# Patient Record
Sex: Female | Born: 1945
Health system: Southern US, Community
[De-identification: ages and names within clinical notes are randomized; demographics above are authoritative.]

## PROBLEM LIST (undated history)

## (undated) DIAGNOSIS — E785 Hyperlipidemia, unspecified: Secondary | ICD-10-CM

## (undated) DIAGNOSIS — Z9889 Other specified postprocedural states: Secondary | ICD-10-CM

## (undated) DIAGNOSIS — R112 Nausea with vomiting, unspecified: Secondary | ICD-10-CM

## (undated) DIAGNOSIS — T7840XA Allergy, unspecified, initial encounter: Secondary | ICD-10-CM

## (undated) DIAGNOSIS — M1990 Unspecified osteoarthritis, unspecified site: Secondary | ICD-10-CM

## (undated) DIAGNOSIS — K219 Gastro-esophageal reflux disease without esophagitis: Secondary | ICD-10-CM

## (undated) DIAGNOSIS — E876 Hypokalemia: Secondary | ICD-10-CM

## (undated) DIAGNOSIS — D689 Coagulation defect, unspecified: Secondary | ICD-10-CM

## (undated) DIAGNOSIS — T4145XA Adverse effect of unspecified anesthetic, initial encounter: Secondary | ICD-10-CM

## (undated) DIAGNOSIS — H409 Unspecified glaucoma: Secondary | ICD-10-CM

## (undated) DIAGNOSIS — I499 Cardiac arrhythmia, unspecified: Secondary | ICD-10-CM

## (undated) DIAGNOSIS — E039 Hypothyroidism, unspecified: Secondary | ICD-10-CM

## (undated) DIAGNOSIS — I82409 Acute embolism and thrombosis of unspecified deep veins of unspecified lower extremity: Secondary | ICD-10-CM

## (undated) DIAGNOSIS — J302 Other seasonal allergic rhinitis: Secondary | ICD-10-CM

## (undated) DIAGNOSIS — T8859XA Other complications of anesthesia, initial encounter: Secondary | ICD-10-CM

## (undated) DIAGNOSIS — H269 Unspecified cataract: Secondary | ICD-10-CM

## (undated) DIAGNOSIS — G4730 Sleep apnea, unspecified: Secondary | ICD-10-CM

## (undated) DIAGNOSIS — I10 Essential (primary) hypertension: Secondary | ICD-10-CM

## (undated) HISTORY — DX: Hyperlipidemia, unspecified: E78.5

## (undated) HISTORY — DX: Allergy, unspecified, initial encounter: T78.40XA

## (undated) HISTORY — PX: COLONOSCOPY: SHX174

## (undated) HISTORY — DX: Unspecified cataract: H26.9

## (undated) HISTORY — PX: ABDOMINAL HYSTERECTOMY: SHX81

## (undated) HISTORY — PX: DENTAL SURGERY: SHX609

## (undated) HISTORY — PX: CATARACT EXTRACTION: SUR2

## (undated) HISTORY — DX: Hypokalemia: E87.6

## (undated) HISTORY — DX: Coagulation defect, unspecified: D68.9

## (undated) HISTORY — DX: Sleep apnea, unspecified: G47.30

## (undated) HISTORY — DX: Acute embolism and thrombosis of unspecified deep veins of unspecified lower extremity: I82.409

## (undated) HISTORY — DX: Unspecified glaucoma: H40.9

---

## 1999-02-14 ENCOUNTER — Other Ambulatory Visit: Admission: RE | Admit: 1999-02-14 | Discharge: 1999-02-14 | Payer: Self-pay | Admitting: *Deleted

## 2000-03-17 ENCOUNTER — Ambulatory Visit (HOSPITAL_BASED_OUTPATIENT_CLINIC_OR_DEPARTMENT_OTHER): Admission: RE | Admit: 2000-03-17 | Discharge: 2000-03-17 | Payer: Self-pay | Admitting: Internal Medicine

## 2000-09-22 ENCOUNTER — Encounter: Admission: RE | Admit: 2000-09-22 | Discharge: 2000-09-22 | Payer: Self-pay | Admitting: Internal Medicine

## 2000-09-22 ENCOUNTER — Encounter: Payer: Self-pay | Admitting: Internal Medicine

## 2003-12-15 ENCOUNTER — Encounter: Admission: RE | Admit: 2003-12-15 | Discharge: 2003-12-15 | Payer: Self-pay | Admitting: Neurosurgery

## 2003-12-29 ENCOUNTER — Encounter: Admission: RE | Admit: 2003-12-29 | Discharge: 2003-12-29 | Payer: Self-pay | Admitting: Neurosurgery

## 2005-04-23 ENCOUNTER — Ambulatory Visit (HOSPITAL_COMMUNITY): Admission: RE | Admit: 2005-04-23 | Discharge: 2005-04-23 | Payer: Self-pay | Admitting: Internal Medicine

## 2006-07-22 ENCOUNTER — Ambulatory Visit (HOSPITAL_COMMUNITY): Admission: RE | Admit: 2006-07-22 | Discharge: 2006-07-22 | Payer: Self-pay | Admitting: Internal Medicine

## 2010-05-06 ENCOUNTER — Encounter: Payer: Self-pay | Admitting: Neurosurgery

## 2010-07-30 ENCOUNTER — Encounter: Payer: Self-pay | Admitting: Internal Medicine

## 2010-07-30 DIAGNOSIS — Q774 Achondroplasia: Secondary | ICD-10-CM

## 2010-07-30 DIAGNOSIS — K219 Gastro-esophageal reflux disease without esophagitis: Secondary | ICD-10-CM

## 2010-07-30 DIAGNOSIS — M1990 Unspecified osteoarthritis, unspecified site: Secondary | ICD-10-CM

## 2010-07-30 DIAGNOSIS — E039 Hypothyroidism, unspecified: Secondary | ICD-10-CM

## 2010-07-30 DIAGNOSIS — I10 Essential (primary) hypertension: Secondary | ICD-10-CM | POA: Insufficient documentation

## 2010-08-21 ENCOUNTER — Encounter: Payer: Self-pay | Admitting: *Deleted

## 2010-11-30 ENCOUNTER — Encounter: Payer: Self-pay | Admitting: Gastroenterology

## 2012-09-17 ENCOUNTER — Other Ambulatory Visit: Payer: Self-pay | Admitting: Neurosurgery

## 2012-09-17 DIAGNOSIS — M549 Dorsalgia, unspecified: Secondary | ICD-10-CM

## 2012-09-25 ENCOUNTER — Ambulatory Visit
Admission: RE | Admit: 2012-09-25 | Discharge: 2012-09-25 | Disposition: A | Discharge status: Home / Self Care | Payer: 59 | Source: Ambulatory Visit | Attending: Neurosurgery | Admitting: Neurosurgery

## 2012-09-25 VITALS — BP 151/62 | HR 61 | Ht <= 58 in | Wt 147.0 lb

## 2012-09-25 DIAGNOSIS — M549 Dorsalgia, unspecified: Secondary | ICD-10-CM

## 2012-09-25 MED ORDER — DIAZEPAM 5 MG PO TABS
5.0000 mg | ORAL_TABLET | Freq: Once | ORAL | Status: AC
  Administered 2012-09-25: 5 mg via ORAL

## 2012-09-25 MED ORDER — ONDANSETRON HCL 4 MG/2ML IJ SOLN
4.0000 mg | Freq: Once | INTRAMUSCULAR | Status: AC
  Administered 2012-09-25: 4 mg via INTRAMUSCULAR

## 2012-09-25 MED ORDER — IOHEXOL 180 MG/ML  SOLN
15.0000 mL | Freq: Once | INTRAMUSCULAR | Status: AC | PRN
  Administered 2012-09-25: 15 mL via INTRATHECAL

## 2012-09-25 MED ORDER — MEPERIDINE HCL 100 MG/ML IJ SOLN
75.0000 mg | Freq: Once | INTRAMUSCULAR | Status: AC
  Administered 2012-09-25: 75 mg via INTRAMUSCULAR

## 2012-10-09 ENCOUNTER — Other Ambulatory Visit: Payer: Self-pay | Admitting: Neurosurgery

## 2012-10-13 ENCOUNTER — Encounter (HOSPITAL_COMMUNITY): Payer: Self-pay | Admitting: Pharmacy Technician

## 2012-10-15 ENCOUNTER — Encounter (HOSPITAL_COMMUNITY)
Admission: RE | Admit: 2012-10-15 | Discharge: 2012-10-15 | Disposition: A | Discharge status: Home / Self Care | Payer: 59 | Source: Ambulatory Visit | Attending: Neurosurgery | Admitting: Neurosurgery

## 2012-10-15 ENCOUNTER — Encounter (HOSPITAL_COMMUNITY): Payer: Self-pay

## 2012-10-15 ENCOUNTER — Ambulatory Visit (HOSPITAL_COMMUNITY)
Admission: RE | Admit: 2012-10-15 | Discharge: 2012-10-15 | Disposition: A | Discharge status: Home / Self Care | Payer: 59 | Source: Ambulatory Visit | Attending: Neurosurgery | Admitting: Neurosurgery

## 2012-10-15 DIAGNOSIS — I77810 Thoracic aortic ectasia: Secondary | ICD-10-CM | POA: Insufficient documentation

## 2012-10-15 DIAGNOSIS — Z0183 Encounter for blood typing: Secondary | ICD-10-CM | POA: Insufficient documentation

## 2012-10-15 DIAGNOSIS — Z01810 Encounter for preprocedural cardiovascular examination: Secondary | ICD-10-CM | POA: Insufficient documentation

## 2012-10-15 DIAGNOSIS — I498 Other specified cardiac arrhythmias: Secondary | ICD-10-CM | POA: Insufficient documentation

## 2012-10-15 DIAGNOSIS — Z01818 Encounter for other preprocedural examination: Secondary | ICD-10-CM | POA: Insufficient documentation

## 2012-10-15 DIAGNOSIS — Z01812 Encounter for preprocedural laboratory examination: Secondary | ICD-10-CM | POA: Insufficient documentation

## 2012-10-15 HISTORY — DX: Cardiac arrhythmia, unspecified: I49.9

## 2012-10-15 HISTORY — DX: Other seasonal allergic rhinitis: J30.2

## 2012-10-15 HISTORY — DX: Gastro-esophageal reflux disease without esophagitis: K21.9

## 2012-10-15 HISTORY — DX: Hypothyroidism, unspecified: E03.9

## 2012-10-15 HISTORY — DX: Essential (primary) hypertension: I10

## 2012-10-15 HISTORY — DX: Other complications of anesthesia, initial encounter: T88.59XA

## 2012-10-15 HISTORY — DX: Nausea with vomiting, unspecified: R11.2

## 2012-10-15 HISTORY — DX: Other specified postprocedural states: Z98.890

## 2012-10-15 HISTORY — DX: Adverse effect of unspecified anesthetic, initial encounter: T41.45XA

## 2012-10-15 HISTORY — DX: Unspecified osteoarthritis, unspecified site: M19.90

## 2012-10-15 LAB — BASIC METABOLIC PANEL
CO2: 26 mEq/L (ref 19–32)
Chloride: 107 mEq/L (ref 96–112)
Creatinine, Ser: 0.84 mg/dL (ref 0.50–1.10)
GFR calc Af Amer: 83 mL/min — ABNORMAL LOW (ref >90)
GFR calc non Af Amer: 72 mL/min — ABNORMAL LOW (ref >90)
Potassium: 3.6 mEq/L (ref 3.5–5.1)
Sodium: 142 mEq/L (ref 135–145)

## 2012-10-15 LAB — CBC
HCT: 39.3 % (ref 36.0–46.0)
MCH: 31.7 pg (ref 26.0–34.0)
MCHC: 34.1 g/dL (ref 30.0–36.0)
RDW: 13.6 % (ref 11.5–15.5)

## 2012-10-15 LAB — SURGICAL PCR SCREEN: Staphylococcus aureus: NEGATIVE

## 2012-10-15 LAB — TYPE AND SCREEN: Antibody Screen: NEGATIVE

## 2012-10-15 NOTE — Pre-Procedure Instructions (Signed)
Kaitlyn Barnes  10/15/2012   Your procedure is scheduled on:  Wednesday, July 9th.  Report to [facility] at 9:15AM.             Enter Main Entrance, take [place] Elevators to 3rd Floor- Short Stay.  Call this number if you have problems the morning of surgery: [phone]   Remember:   Do not eat food or drink liquids after midnight.   Take these medicines the morning of surgery with A SIP OF WATER: fexofenadine (ALLEGRA), GuaiFENesin (MUCINEX PO),levothyroxine (SYNTHROID, LEVOTHROID), omeprazole (PRILOSEC)  Stop taking Aspirin, Coumadin, Plavix, Effient and Herbal medications.  Do not take any NSAIDs OZ:DGUYQIHKV (MOBIC)    Ibuprofen,  Advil,Naproxen or any medication containing Aspirin.      Do not wear jewelry, make-up or nail polish.  Do not wear lotions, powders, or perfumes. You may wear deodorant.  Do not shave 48 hours prior to surgery. Men may shave face and neck.  Do not bring valuables to the hospital.  [facility] is not responsible for any belongings or valuables.  Contacts, dentures or bridgework may not be worn into surgery.  Leave suitcase in the car. After surgery it may be brought to your room.  For patients admitted to the hospital, checkout time is 11:00 AM the day of discharge.    Special Instructions: Shower using CHG 2 nights before surgery and the night before surgery.  If you shower the day of surgery use CHG.  Use special wash - you have one bottle of CHG for all showers.  You should use approximately 1/3 of the bottle for each shower.   Please read over the following fact sheets that you were given: Pain Booklet, Coughing and Deep Breathing, Blood Transfusion Information and Surgical Site Infection Prevention

## 2012-10-15 NOTE — Progress Notes (Signed)
10/15/12 1210  OBSTRUCTIVE SLEEP APNEA  Have you ever been diagnosed with sleep apnea through a sleep study? No  Do you snore loudly (loud enough to be heard through closed doors)?  1  Do you often feel tired, fatigued, or sleepy during the daytime? 0  Has anyone observed you stop breathing during your sleep? 0  Do you have, or are you being treated for high blood pressure? 1  BMI more than 35 kg/m2? 1  Age over 65 years old? 1  Neck circumference greater than 40 cm/18 inches? 1  Gender: 0  Obstructive Sleep Apnea Score 5  Score 4 or greater  Results sent to PCP

## 2012-10-20 MED ORDER — CEFAZOLIN SODIUM-DEXTROSE 2-3 GM-% IV SOLR
2.0000 g | INTRAVENOUS | Status: AC
Start: 2012-10-21 — End: 2012-10-21
  Administered 2012-10-21: 2 g via INTRAVENOUS
  Filled 2012-10-20: qty 50

## 2012-10-21 ENCOUNTER — Ambulatory Visit (HOSPITAL_COMMUNITY): Payer: 59

## 2012-10-21 ENCOUNTER — Encounter (HOSPITAL_COMMUNITY)
Admission: RE | Disposition: A | Discharge status: Home / Self Care | Payer: Self-pay | Source: Ambulatory Visit | Attending: Neurosurgery

## 2012-10-21 ENCOUNTER — Encounter (HOSPITAL_COMMUNITY): Payer: Self-pay | Admitting: Certified Registered Nurse Anesthetist

## 2012-10-21 ENCOUNTER — Encounter (HOSPITAL_COMMUNITY): Payer: Self-pay | Admitting: *Deleted

## 2012-10-21 ENCOUNTER — Ambulatory Visit (HOSPITAL_COMMUNITY): Payer: 59 | Admitting: Certified Registered Nurse Anesthetist

## 2012-10-21 ENCOUNTER — Inpatient Hospital Stay (HOSPITAL_COMMUNITY)
Admission: RE | Admit: 2012-10-21 | Discharge: 2012-10-24 | DRG: 460 | Disposition: A | Discharge status: Home / Self Care | Payer: 59 | Source: Ambulatory Visit | Attending: Neurosurgery | Admitting: Neurosurgery

## 2012-10-21 DIAGNOSIS — E039 Hypothyroidism, unspecified: Secondary | ICD-10-CM | POA: Diagnosis present

## 2012-10-21 DIAGNOSIS — Q762 Congenital spondylolisthesis: Secondary | ICD-10-CM

## 2012-10-21 DIAGNOSIS — Z981 Arthrodesis status: Secondary | ICD-10-CM

## 2012-10-21 DIAGNOSIS — K219 Gastro-esophageal reflux disease without esophagitis: Secondary | ICD-10-CM | POA: Diagnosis present

## 2012-10-21 DIAGNOSIS — I10 Essential (primary) hypertension: Secondary | ICD-10-CM | POA: Diagnosis present

## 2012-10-21 DIAGNOSIS — M48062 Spinal stenosis, lumbar region with neurogenic claudication: Principal | ICD-10-CM | POA: Diagnosis present

## 2012-10-21 DIAGNOSIS — F17200 Nicotine dependence, unspecified, uncomplicated: Secondary | ICD-10-CM | POA: Diagnosis present

## 2012-10-21 DIAGNOSIS — M129 Arthropathy, unspecified: Secondary | ICD-10-CM | POA: Diagnosis present

## 2012-10-21 HISTORY — PX: BACK SURGERY: SHX140

## 2012-10-21 LAB — CBC
HCT: 36.8 % (ref 36.0–46.0)
RDW: 13.6 % (ref 11.5–15.5)
WBC: 7.8 10*3/uL (ref 4.0–10.5)

## 2012-10-21 SURGERY — LAMINECTOMY WITH POSTERIOR LATERAL ARTHRODESIS LEVEL 3
Anesthesia: General | Wound class: Clean

## 2012-10-21 MED ORDER — SODIUM CHLORIDE 0.9 % IJ SOLN: 9.0000 mL | INTRAMUSCULAR | Status: DC | PRN

## 2012-10-21 MED ORDER — DIAZEPAM 5 MG PO TABS
5.0000 mg | ORAL_TABLET | Freq: Four times a day (QID) | ORAL | Status: DC | PRN
  Administered 2012-10-23 – 2012-10-24 (×5): 5 mg via ORAL
  Filled 2012-10-21 (×5): qty 1

## 2012-10-21 MED ORDER — POTASSIUM CHLORIDE ER 10 MEQ PO TBCR
20.0000 meq | EXTENDED_RELEASE_TABLET | Freq: Two times a day (BID) | ORAL | Status: DC
  Administered 2012-10-21 – 2012-10-24 (×6): 20 meq via ORAL
  Filled 2012-10-21 (×7): qty 2

## 2012-10-21 MED ORDER — FENTANYL CITRATE 0.05 MG/ML IJ SOLN
INTRAMUSCULAR | Status: DC | PRN
  Administered 2012-10-21 (×5): 50 ug via INTRAVENOUS

## 2012-10-21 MED ORDER — PHENOL 1.4 % MT LIQD: 1.0000 | OROMUCOSAL | Status: DC | PRN

## 2012-10-21 MED ORDER — OXYCODONE-ACETAMINOPHEN 5-325 MG PO TABS
1.0000 | ORAL_TABLET | ORAL | Status: DC | PRN
  Administered 2012-10-22: 2 via ORAL
  Administered 2012-10-22 (×2): 1 via ORAL
  Administered 2012-10-22 – 2012-10-23 (×3): 2 via ORAL
  Administered 2012-10-23: 1 via ORAL
  Administered 2012-10-23 – 2012-10-24 (×5): 2 via ORAL
  Filled 2012-10-21: qty 2
  Filled 2012-10-21: qty 1
  Filled 2012-10-21 (×3): qty 2
  Filled 2012-10-21: qty 1
  Filled 2012-10-21 (×7): qty 2

## 2012-10-21 MED ORDER — MENTHOL 3 MG MT LOZG: 1.0000 | LOZENGE | OROMUCOSAL | Status: DC | PRN

## 2012-10-21 MED ORDER — AMLODIPINE BESY-BENAZEPRIL HCL 10-20 MG PO CAPS: 1.0000 | ORAL_CAPSULE | Freq: Every day | ORAL | Status: DC

## 2012-10-21 MED ORDER — 0.9 % SODIUM CHLORIDE (POUR BTL) OPTIME
TOPICAL | Status: DC | PRN
  Administered 2012-10-21: 1000 mL

## 2012-10-21 MED ORDER — ATORVASTATIN CALCIUM 20 MG PO TABS
20.0000 mg | ORAL_TABLET | Freq: Every day | ORAL | Status: DC
  Administered 2012-10-21 – 2012-10-23 (×3): 20 mg via ORAL
  Filled 2012-10-21 (×4): qty 1

## 2012-10-21 MED ORDER — GLYCOPYRROLATE 0.2 MG/ML IJ SOLN
INTRAMUSCULAR | Status: DC | PRN
  Administered 2012-10-21: 0.6 mg via INTRAVENOUS

## 2012-10-21 MED ORDER — LACTATED RINGERS IV SOLN
INTRAVENOUS | Status: DC | PRN
  Administered 2012-10-21 (×3): via INTRAVENOUS

## 2012-10-21 MED ORDER — NEOSTIGMINE METHYLSULFATE 1 MG/ML IJ SOLN
INTRAMUSCULAR | Status: DC | PRN
  Administered 2012-10-21: 4 mg via INTRAVENOUS

## 2012-10-21 MED ORDER — ACETAMINOPHEN 325 MG PO TABS: 650.0000 mg | ORAL_TABLET | ORAL | Status: DC | PRN

## 2012-10-21 MED ORDER — ARTIFICIAL TEARS OP OINT
TOPICAL_OINTMENT | OPHTHALMIC | Status: DC | PRN
  Administered 2012-10-21: 1 via OPHTHALMIC

## 2012-10-21 MED ORDER — AMLODIPINE BESYLATE 10 MG PO TABS
10.0000 mg | ORAL_TABLET | Freq: Every day | ORAL | Status: DC
  Administered 2012-10-22 – 2012-10-24 (×3): 10 mg via ORAL
  Filled 2012-10-21 (×3): qty 1

## 2012-10-21 MED ORDER — HYDROMORPHONE HCL PF 1 MG/ML IJ SOLN
INTRAMUSCULAR | Status: AC
  Filled 2012-10-21: qty 1

## 2012-10-21 MED ORDER — NALOXONE HCL 0.4 MG/ML IJ SOLN: 0.4000 mg | INTRAMUSCULAR | Status: DC | PRN

## 2012-10-21 MED ORDER — SODIUM CHLORIDE 0.9 % IJ SOLN
3.0000 mL | Freq: Two times a day (BID) | INTRAMUSCULAR | Status: DC
  Administered 2012-10-22 – 2012-10-24 (×4): 3 mL via INTRAVENOUS

## 2012-10-21 MED ORDER — ALBUMIN HUMAN 5 % IV SOLN
INTRAVENOUS | Status: DC | PRN
  Administered 2012-10-21: 15:00:00 via INTRAVENOUS

## 2012-10-21 MED ORDER — PROPOFOL 10 MG/ML IV BOLUS
INTRAVENOUS | Status: DC | PRN
  Administered 2012-10-21: 110 mg via INTRAVENOUS

## 2012-10-21 MED ORDER — CEFAZOLIN SODIUM 1-5 GM-% IV SOLN
1.0000 g | Freq: Three times a day (TID) | INTRAVENOUS | Status: AC
  Administered 2012-10-21 – 2012-10-22 (×2): 1 g via INTRAVENOUS
  Filled 2012-10-21 (×2): qty 50

## 2012-10-21 MED ORDER — BACITRACIN ZINC 500 UNIT/GM EX OINT
TOPICAL_OINTMENT | CUTANEOUS | Status: DC | PRN
  Administered 2012-10-21: 1 via TOPICAL

## 2012-10-21 MED ORDER — THROMBIN 5000 UNITS EX SOLR
CUTANEOUS | Status: DC | PRN
  Administered 2012-10-21 (×2): 5000 [IU] via TOPICAL

## 2012-10-21 MED ORDER — HYDROMORPHONE HCL PF 1 MG/ML IJ SOLN
0.2500 mg | INTRAMUSCULAR | Status: DC | PRN
  Administered 2012-10-21 (×2): 0.5 mg via INTRAVENOUS

## 2012-10-21 MED ORDER — ROCURONIUM BROMIDE 100 MG/10ML IV SOLN
INTRAVENOUS | Status: DC | PRN
  Administered 2012-10-21: 30 mg via INTRAVENOUS
  Administered 2012-10-21: 10 mg via INTRAVENOUS
  Administered 2012-10-21: 20 mg via INTRAVENOUS

## 2012-10-21 MED ORDER — OXYCODONE HCL 5 MG/5ML PO SOLN: 5.0000 mg | Freq: Once | ORAL | Status: AC | PRN

## 2012-10-21 MED ORDER — DEXAMETHASONE SODIUM PHOSPHATE 4 MG/ML IJ SOLN
INTRAMUSCULAR | Status: DC | PRN
  Administered 2012-10-21: 4 mg via INTRAVENOUS

## 2012-10-21 MED ORDER — PHENYLEPHRINE HCL 10 MG/ML IJ SOLN
INTRAMUSCULAR | Status: DC | PRN
  Administered 2012-10-21 (×2): 40 ug via INTRAVENOUS

## 2012-10-21 MED ORDER — BENAZEPRIL HCL 20 MG PO TABS
20.0000 mg | ORAL_TABLET | Freq: Every day | ORAL | Status: DC
  Administered 2012-10-22 – 2012-10-24 (×3): 20 mg via ORAL
  Filled 2012-10-21 (×3): qty 1

## 2012-10-21 MED ORDER — DIPHENHYDRAMINE HCL 12.5 MG/5ML PO ELIX: 12.5000 mg | ORAL_SOLUTION | Freq: Four times a day (QID) | ORAL | Status: DC | PRN

## 2012-10-21 MED ORDER — OXYCODONE HCL 5 MG PO TABS
ORAL_TABLET | ORAL | Status: AC
  Filled 2012-10-21: qty 1

## 2012-10-21 MED ORDER — LIDOCAINE HCL 4 % MT SOLN
OROMUCOSAL | Status: DC | PRN
  Administered 2012-10-21: 4 mL via TOPICAL

## 2012-10-21 MED ORDER — LEVOTHYROXINE SODIUM 100 MCG PO TABS
100.0000 ug | ORAL_TABLET | Freq: Every day | ORAL | Status: DC
  Administered 2012-10-22 – 2012-10-24 (×3): 100 ug via ORAL
  Filled 2012-10-21 (×5): qty 1

## 2012-10-21 MED ORDER — HEMOSTATIC AGENTS (NO CHARGE) OPTIME
TOPICAL | Status: DC | PRN
  Administered 2012-10-21 (×2): 1 via TOPICAL

## 2012-10-21 MED ORDER — SODIUM CHLORIDE 0.9 % IV SOLN: 250.0000 mL | INTRAVENOUS | Status: DC

## 2012-10-21 MED ORDER — MIDAZOLAM HCL 5 MG/5ML IJ SOLN
INTRAMUSCULAR | Status: DC | PRN
  Administered 2012-10-21: 2 mg via INTRAVENOUS

## 2012-10-21 MED ORDER — DIPHENHYDRAMINE HCL 50 MG/ML IJ SOLN: 12.5000 mg | Freq: Four times a day (QID) | INTRAMUSCULAR | Status: DC | PRN

## 2012-10-21 MED ORDER — OXYCODONE HCL 5 MG PO TABS
5.0000 mg | ORAL_TABLET | Freq: Once | ORAL | Status: AC | PRN
  Administered 2012-10-21: 5 mg via ORAL

## 2012-10-21 MED ORDER — SODIUM CHLORIDE 0.9 % IJ SOLN: 3.0000 mL | INTRAMUSCULAR | Status: DC | PRN

## 2012-10-21 MED ORDER — ONDANSETRON HCL 4 MG/2ML IJ SOLN: 4.0000 mg | INTRAMUSCULAR | Status: DC | PRN

## 2012-10-21 MED ORDER — PROMETHAZINE HCL 25 MG/ML IJ SOLN: 6.2500 mg | INTRAMUSCULAR | Status: DC | PRN

## 2012-10-21 MED ORDER — EPHEDRINE SULFATE 50 MG/ML IJ SOLN
INTRAMUSCULAR | Status: DC | PRN
  Administered 2012-10-21 (×2): 10 mg via INTRAVENOUS
  Administered 2012-10-21: 5 mg via INTRAVENOUS
  Administered 2012-10-21: 2.5 mg via INTRAVENOUS

## 2012-10-21 MED ORDER — MORPHINE SULFATE (PF) 1 MG/ML IV SOLN
INTRAVENOUS | Status: DC
  Filled 2012-10-21: qty 25

## 2012-10-21 MED ORDER — ZOLPIDEM TARTRATE 5 MG PO TABS: 5.0000 mg | ORAL_TABLET | Freq: Every evening | ORAL | Status: DC | PRN

## 2012-10-21 MED ORDER — SODIUM CHLORIDE 0.9 % IV SOLN
INTRAVENOUS | Status: DC
  Administered 2012-10-21: 20:00:00 via INTRAVENOUS

## 2012-10-21 MED ORDER — ACETAMINOPHEN 650 MG RE SUPP: 650.0000 mg | RECTAL | Status: DC | PRN

## 2012-10-21 MED ORDER — ONDANSETRON HCL 4 MG/2ML IJ SOLN: 4.0000 mg | Freq: Four times a day (QID) | INTRAMUSCULAR | Status: DC | PRN

## 2012-10-21 MED ORDER — PANTOPRAZOLE SODIUM 40 MG PO TBEC
40.0000 mg | DELAYED_RELEASE_TABLET | Freq: Every day | ORAL | Status: DC
  Administered 2012-10-22 – 2012-10-24 (×3): 40 mg via ORAL
  Filled 2012-10-21: qty 1

## 2012-10-21 SURGICAL SUPPLY — 65 items
APL SKNCLS STERI-STRIP NONHPOA (GAUZE/BANDAGES/DRESSINGS) ×1
BENZOIN TINCTURE PRP APPL 2/3 (GAUZE/BANDAGES/DRESSINGS) ×2 IMPLANT
BLADE SURG ROTATE 9660 (MISCELLANEOUS) IMPLANT
BUR ACORN 6.0 (BURR) ×2 IMPLANT
BUR MATCHSTICK NEURO 3.0 LAGG (BURR) ×2 IMPLANT
CANISTER SUCTION 2500CC (MISCELLANEOUS) ×2 IMPLANT
CLOTH BEACON ORANGE TIMEOUT ST (SAFETY) ×2 IMPLANT
CONT SPEC 4OZ CLIKSEAL STRL BL (MISCELLANEOUS) ×2 IMPLANT
DRAPE C-ARM 42X72 X-RAY (DRAPES) ×2 IMPLANT
DRAPE LAPAROTOMY 100X72X124 (DRAPES) ×2 IMPLANT
DRAPE MICROSCOPE LEICA (MISCELLANEOUS) ×2 IMPLANT
DRAPE POUCH INSTRU U-SHP 10X18 (DRAPES) ×2 IMPLANT
DRSG PAD ABDOMINAL 8X10 ST (GAUZE/BANDAGES/DRESSINGS) IMPLANT
DURAPREP 26ML APPLICATOR (WOUND CARE) ×2 IMPLANT
ELECT BLADE 4.0 EZ CLEAN MEGAD (MISCELLANEOUS) ×2
ELECT REM PT RETURN 9FT ADLT (ELECTROSURGICAL) ×2
ELECTRODE BLDE 4.0 EZ CLN MEGD (MISCELLANEOUS) IMPLANT
ELECTRODE REM PT RTRN 9FT ADLT (ELECTROSURGICAL) ×1 IMPLANT
EVACUATOR 3/16  PVC DRAIN (DRAIN) ×1
EVACUATOR 3/16 PVC DRAIN (DRAIN) IMPLANT
GAUZE SPONGE 4X4 16PLY XRAY LF (GAUZE/BANDAGES/DRESSINGS) ×2 IMPLANT
GLOVE BIOGEL M 8.0 STRL (GLOVE) ×5 IMPLANT
GLOVE ECLIPSE 7.5 STRL STRAW (GLOVE) ×8 IMPLANT
GLOVE EXAM NITRILE LRG STRL (GLOVE) ×2 IMPLANT
GLOVE EXAM NITRILE MD LF STRL (GLOVE) IMPLANT
GLOVE EXAM NITRILE XL STR (GLOVE) IMPLANT
GLOVE EXAM NITRILE XS STR PU (GLOVE) IMPLANT
GLOVE INDICATOR 7.5 STRL GRN (GLOVE) ×1 IMPLANT
GLOVE INDICATOR 8.0 STRL GRN (GLOVE) ×2 IMPLANT
GOWN BRE IMP SLV AUR LG STRL (GOWN DISPOSABLE) ×3 IMPLANT
GOWN BRE IMP SLV AUR XL STRL (GOWN DISPOSABLE) IMPLANT
GOWN STRL REIN 2XL LVL4 (GOWN DISPOSABLE) IMPLANT
KIT BASIN OR (CUSTOM PROCEDURE TRAY) ×2 IMPLANT
KIT ROOM TURNOVER OR (KITS) ×2 IMPLANT
MILL MEDIUM DISP (BLADE) ×2 IMPLANT
NDL HYPO 18GX1.5 BLUNT FILL (NEEDLE) IMPLANT
NDL HYPO 25X1 1.5 SAFETY (NEEDLE) IMPLANT
NEEDLE HYPO 18GX1.5 BLUNT FILL (NEEDLE) IMPLANT
NEEDLE HYPO 21X1.5 SAFETY (NEEDLE) IMPLANT
NEEDLE HYPO 25X1 1.5 SAFETY (NEEDLE) IMPLANT
NEEDLE SPNL 20GX3.5 QUINCKE YW (NEEDLE) IMPLANT
NS IRRIG 1000ML POUR BTL (IV SOLUTION) ×2 IMPLANT
PACK FOAM VITOSS 10CC (Orthopedic Implant) ×4 IMPLANT
PACK LAMINECTOMY NEURO (CUSTOM PROCEDURE TRAY) ×2 IMPLANT
PAD ARMBOARD 7.5X6 YLW CONV (MISCELLANEOUS) ×6 IMPLANT
PATTIES SURGICAL .5 X1 (DISPOSABLE) ×2 IMPLANT
RUBBERBAND STERILE (MISCELLANEOUS) ×4 IMPLANT
SPONGE GAUZE 4X4 12PLY (GAUZE/BANDAGES/DRESSINGS) ×2 IMPLANT
SPONGE LAP 4X18 X RAY DECT (DISPOSABLE) IMPLANT
SPONGE SURGIFOAM ABS GEL SZ50 (HEMOSTASIS) ×2 IMPLANT
STAPLER VISISTAT 35W (STAPLE) ×2 IMPLANT
STRIP CLOSURE SKIN 1/2X4 (GAUZE/BANDAGES/DRESSINGS) ×2 IMPLANT
SUT NURALON 4 0 TR CR/8 (SUTURE) ×2 IMPLANT
SUT PROLENE 6 0 BV (SUTURE) ×1 IMPLANT
SUT VIC AB 0 CT1 18XCR BRD8 (SUTURE) ×1 IMPLANT
SUT VIC AB 0 CT1 8-18 (SUTURE) ×4
SUT VIC AB 2-0 CP2 18 (SUTURE) ×2 IMPLANT
SUT VIC AB 3-0 SH 8-18 (SUTURE) ×4 IMPLANT
SYR 20CC LL (SYRINGE) IMPLANT
SYR 20ML ECCENTRIC (SYRINGE) ×2 IMPLANT
SYR 5ML LL (SYRINGE) IMPLANT
TAPE CLOTH SURG 4X10 WHT LF (GAUZE/BANDAGES/DRESSINGS) ×1 IMPLANT
TOWEL OR 17X24 6PK STRL BLUE (TOWEL DISPOSABLE) ×2 IMPLANT
TOWEL OR 17X26 10 PK STRL BLUE (TOWEL DISPOSABLE) ×2 IMPLANT
WATER STERILE IRR 1000ML POUR (IV SOLUTION) ×2 IMPLANT

## 2012-10-21 NOTE — Progress Notes (Signed)
Op note [phone]

## 2012-10-21 NOTE — Preoperative (Signed)
Beta Blockers   Reason not to administer Beta Blockers:Not Applicable 

## 2012-10-21 NOTE — Anesthesia Procedure Notes (Signed)
Procedure Name: Intubation Date/Time: 10/21/2012 12:12 PM Performed by: Orvilla Fus A Pre-anesthesia Checklist: Patient identified, Timeout performed, Emergency Drugs available, Suction available and Patient being monitored Patient Re-evaluated:Patient Re-evaluated prior to inductionOxygen Delivery Method: Circle system utilized Preoxygenation: Pre-oxygenation with 100% oxygen Intubation Type: IV induction Ventilation: Mask ventilation without difficulty and Oral airway inserted - appropriate to patient size Laryngoscope Size: Mac and 3 Grade View: Grade II Tube type: Oral Tube size: 7.0 mm Number of attempts: 1 Airway Equipment and Method: Stylet and LTA kit utilized Placement Confirmation: ETT inserted through vocal cords under direct vision,  breath sounds checked- equal and bilateral and positive ETCO2 Secured at: 19 cm Tube secured with: Tape Dental Injury: Teeth and Oropharynx as per pre-operative assessment

## 2012-10-21 NOTE — Anesthesia Postprocedure Evaluation (Signed)
Anesthesia Post Note  Patient: Kaitlyn Barnes  Procedure(s) Performed: Procedure(s) (LRB): Lumbar two to lumbar five Laminectomy/posterolateral arthrodesis with bmp/cellsaver (N/A)  Anesthesia type: general  Patient location: PACU  Post pain: Pain level controlled  Post assessment: Patient's Cardiovascular Status Stable  Last Vitals:  Filed Vitals:   10/21/12 1633  BP: 106/53  Pulse: 81  Temp: 36.7 C  Resp: 16    Post vital signs: Reviewed and stable  Level of consciousness: sedated  Complications: No apparent anesthesia complications

## 2012-10-21 NOTE — Transfer of Care (Signed)
Immediate Anesthesia Transfer of Care Note  Patient: Kaitlyn Barnes  Procedure(s) Performed: Procedure(s) with comments: Lumbar two to lumbar five Laminectomy/posterolateral arthrodesis with bmp/cellsaver (N/A) - Lumbar two to lumbar five Laminectomy/posterolateral arthrodesis    Patient Location: PACU  Anesthesia Type:General  Level of Consciousness: awake, alert  and oriented  Airway & Oxygen Therapy: Patient Spontanous Breathing and Patient connected to nasal cannula oxygen  Post-op Assessment: Report given to PACU RN, Post -op Vital signs reviewed and stable and Patient moving all extremities  Post vital signs: Reviewed and stable  Complications: No apparent anesthesia complications

## 2012-10-21 NOTE — H&P (Signed)
Kaitlyn Barnes is an 67 y.o. female.   Chief Complaint: lbp HPI: patient who came to see me complaining of lumbar pain with radiation to both legs,which gets worse with ambulation. She has failed with conservative treatment. Myelogram shows stenosis from l2 to l5.  Past Medical History  Diagnosis Date  . Complication of anesthesia   . PONV (postoperative nausea and vomiting)     nausea after C-Section  . Hypertension   . Dysrhythmia     irregular  . Hypothyroidism   . Seasonal allergies   . GERD (gastroesophageal reflux disease)   . Arthritis     Past Surgical History  Procedure Laterality Date  . Abdominal hysterectomy    . Cesarean section    . Dental surgery      removed    No family history on file. Social History:  reports that she has been smoking Cigarettes.  She has a 6 pack-year smoking history. She has never used smokeless tobacco. She reports that she drinks about 1.8 ounces of alcohol per week. Her drug history is not on file.  Allergies:  Allergies  Allergen Reactions  . Other Hives and Cough    Dogs , Cats, Rag Weed    No prescriptions prior to admission    No results found for this or any previous visit (from the past 48 hour(s)). No results found.  Review of Systems  Constitutional: Negative.   Eyes: Negative.        Glaucoma  Cardiovascular: Negative.   Genitourinary: Negative.   Musculoskeletal: Positive for back pain.  Neurological: Positive for sensory change, focal weakness and headaches.  Endo/Heme/Allergies: Negative.   Psychiatric/Behavioral: Negative.     There were no vitals taken for this visit. Physical Exam 4'2'', 150 lbs. Hent, nl. Neck,nl. Cv, nl. Lungs, clear. Abdomen, soft. Extremities, nl. NEURO reflexes, nl. slr positive at 60. Femoral stretch maneuver positive, nl myelo gram is positive for stenosis from l2 to l5  Assessment/Plan Decompression from l2 to l5 with PLA. She is aware of risks and  complications  [name] 10/21/2012, 9:00 AM

## 2012-10-21 NOTE — Progress Notes (Signed)
Patient declines using PCA pump. Dr. Yetta Barre on call notified and gave verbal order to d/c pump. Order carried out.

## 2012-10-21 NOTE — Anesthesia Preprocedure Evaluation (Addendum)
Anesthesia Evaluation  Patient identified by MRN, date of birth, ID band Patient awake    Reviewed: Allergy & Precautions, H&P , NPO status , Patient's Chart, lab work & pertinent test results  History of Anesthesia Complications (+) PONV  Airway Mallampati: III TM Distance: >3 FB Neck ROM: Full    Dental  (+) Dental Advisory Given and Poor Dentition   Pulmonary Current Smoker,    Pulmonary exam normal       Cardiovascular hypertension, Pt. on medications     Neuro/Psych negative psych ROS   GI/Hepatic Neg liver ROS, GERD-  Medicated,  Endo/Other  Hypothyroidism   Renal/GU negative Renal ROS     Musculoskeletal   Abdominal   Peds  Hematology   Anesthesia Other Findings   Reproductive/Obstetrics                          Anesthesia Physical Anesthesia Plan  ASA: II  Anesthesia Plan: General   Post-op Pain Management:    Induction: Intravenous  Airway Management Planned: Oral ETT  Additional Equipment:   Intra-op Plan:   Post-operative Plan: Extubation in OR  Informed Consent: I have reviewed the patients History and Physical, chart, labs and discussed the procedure including the risks, benefits and alternatives for the proposed anesthesia with the patient or authorized representative who has indicated his/her understanding and acceptance.   Dental advisory given  Plan Discussed with: CRNA, Anesthesiologist and Surgeon  Anesthesia Plan Comments:        Anesthesia Quick Evaluation

## 2012-10-22 MED FILL — Heparin Sodium (Porcine) Inj 1000 Unit/ML: INTRAMUSCULAR | Qty: 30 | Status: AC

## 2012-10-22 MED FILL — Sodium Chloride IV Soln 0.9%: INTRAVENOUS | Qty: 1000 | Status: AC

## 2012-10-22 MED FILL — Sodium Chloride Irrigation Soln 0.9%: Qty: 3000 | Status: AC

## 2012-10-22 NOTE — Progress Notes (Signed)
Patient ID: Kaitlyn Barnes, female   DOB: 07/07/1945, 67 y.o.   MRN: [id number] Doing well, ambulating. No pain in legs. Pt helping

## 2012-10-22 NOTE — Progress Notes (Signed)
Occupational Therapy Evaluation Patient Details Name: Kaitlyn Barnes MRN: [id number] DOB: 1945-09-18 Today's Date: 10/22/2012 Time: 7425-9563 OT Time Calculation (min): 47 min  OT Assessment / Plan / Recommendation History of present illness Pt is s/p L2-5 laminectomy/posterolateral arthrodesis with bmp/cell saver. Pt is short in stature (4'2") and very pleasant.   Clinical Impression   Pt is a pleasant loquacious woman. Pt requires a step to access bed, toilet, sink, chair. Pt performed ADL and was educated on cup method for brushing teeth, and interested in AE for peri care. Pt would benefit from skilled OT while in the acute setting before d/c home with OPOT (depending on progression) Next session should educated on AE available for peri care and addressing LB and possibly tub transfer    OT Assessment  Patient needs continued OT Services    Follow Up Recommendations  Outpatient OT       Equipment Recommendations  Other (comment) (See how Pt Progresses. TBD)       Frequency  Min 2X/week    Precautions / Restrictions Precautions Precautions: Back Precaution Booklet Issued: Yes (comment) Precaution Comments: Pt edcuated on BAT precautions Restrictions Weight Bearing Restrictions: No   Pertinent Vitals/Pain Pt reported pain as 5/10. Pt repositioned in chair.    ADL  Eating/Feeding: Independent Where Assessed - Eating/Feeding: Chair Grooming: Wash/dry hands;Wash/dry face;Teeth care;Set up Where Assessed - Grooming: Supported standing Upper Body Bathing: Set up Where Assessed - Upper Body Bathing: Supported standing Lower Body Bathing: Maximal assistance Where Assessed - Lower Body Bathing: Unsupported sitting Where Assessed - Upper Body Dressing: Unsupported sitting Lower Body Dressing: Maximal assistance Where Assessed - Lower Body Dressing: Unsupported sitting Toilet Transfer: Min guard Toilet Transfer Method: Sit to Barista: Regular height  toilet;Other (comment) (step to access toilet at [facility]) Toileting - Clothing Manipulation and Hygiene: Moderate assistance Where Assessed - Toileting Clothing Manipulation and Hygiene: Sit to stand from 3-in-1 or toilet Tub/Shower Transfer: Maximal assistance Tub/Shower Transfer Method: Stand pivot Equipment Used: Rolling walker;Gait belt Transfers/Ambulation Related to ADLs: Pt min guard for transfers and min A for ambulation. Pt needed vc's for foot placement when using steps to access sink, toilet, bed, chair ADL Comments: Pt performed sink level ADL with help of step, and     OT Diagnosis: Acute pain;Generalized weakness  OT Problem List: Decreased range of motion;Decreased activity tolerance;Decreased knowledge of use of DME or AE;Decreased knowledge of precautions;Pain OT Treatment Interventions: Self-care/ADL training;DME and/or AE instruction;Therapeutic activities;Patient/family education   OT Goals(Current goals can be found in the care plan section) Acute Rehab OT Goals Patient Stated Goal: "To get back to work" OT Goal Formulation: With patient Time For Goal Achievement: 11/05/12 Potential to Achieve Goals: Good  Visit Information  Assistance Needed: +1 PT/OT Co-Evaluation/Treatment: Yes History of Present Illness: Pt is s/p L2-5 laminectomy/posterolateral arthrodesis with bmp/cell saver. Pt is short in stature (4'2") and very pleasant.       Prior Functioning     Home Living Family/patient expects to be discharged to:: Private residence Living Arrangements: Spouse/significant other;Children;Parent Available Help at Discharge: Family Type of Home: House Home Access: Stairs to enter Secretary/administrator of Steps: 2 Entrance Stairs-Rails: Right;Left Home Layout: One level Home Equipment: None Additional Comments: tub shower and regular toilet Prior Function Level of Independence: Independent Communication Communication: No difficulties Dominant Hand: Right          Vision/Perception Vision - History Baseline Vision: Wears glasses all the time Patient Visual Report: No change from  baseline Vision - Assessment Eye Alignment: Within Functional Limits   Cognition  Cognition Arousal/Alertness: Awake/alert Behavior During Therapy: WFL for tasks assessed/performed Overall Cognitive Status: Within Functional Limits for tasks assessed    Extremity/Trunk Assessment Lower Extremity Assessment Lower Extremity Assessment: Generalized weakness Cervical / Trunk Assessment Cervical / Trunk Assessment: Normal     Mobility Bed Mobility Bed Mobility: Not assessed Details for Bed Mobility Assistance: Bed mobility on BAT handout Transfers Transfers: Sit to Stand;Stand to Sit Sit to Stand: 4: Min guard;With upper extremity assist;From bed;Other (comment) (with step) Stand to Sit: 4: Min guard;With upper extremity assist;To chair/3-in-1;Other (comment) (with step) Details for Transfer Assistance: vc's for hand placement-- use of stool is essential.        Balance Balance Balance Assessed: No   End of Session OT - End of Session Equipment Utilized During Treatment: Gait belt;Rolling walker;Other (comment) (step used throughout) Activity Tolerance: Patient tolerated treatment well Patient left: in chair;with call bell/phone within reach Nurse Communication: Mobility status  GO     Kaitlyn Barnes 10/22/2012, 4:11 PM

## 2012-10-22 NOTE — Evaluation (Signed)
Physical Therapy Evaluation Patient Details Name: Kaitlyn Barnes MRN: [id number] DOB: September 28, 1945 Today's Date: 10/22/2012 Time: 4098-1191 PT Time Calculation (min): 52 min  PT Assessment / Plan / Recommendation History of Present Illness  Pt is s/p L2-5 laminectomy/posterolateral arthrodesis with bmp/cell saver. Pt is short in stature (4'2") and very pleasant.  Clinical Impression  Reinforced all back precautions/education.  Emphasized how to use the stool to safely follow her precautions.    PT Assessment  Patient needs continued PT services    Follow Up Recommendations  No PT follow up;Supervision for mobility/OOB    Does the patient have the potential to tolerate intense rehabilitation      Barriers to Discharge        Equipment Recommendations  Rolling walker with 5" wheels (Youth RW--pt is 4'2")    Recommendations for Other Services     Frequency Min 5X/week    Precautions / Restrictions Precautions Precautions: Back Precaution Booklet Issued: Yes (comment) Precaution Comments: Pt edcuated on BAT precautions Restrictions Weight Bearing Restrictions: No   Pertinent Vitals/Pain 5 /10 back pain       Mobility  Bed Mobility Bed Mobility: Not assessed Details for Bed Mobility Assistance: reinforced bed mobility. log roll Transfers Transfers: Sit to Stand;Stand to Sit Sit to Stand: 4: Min guard;With upper extremity assist;From bed;Other (comment) (with step) Stand to Sit: 4: Min guard;With upper extremity assist;To chair/3-in-1;To toilet Details for Transfer Assistance: vc's for hand placement-- use of stool is essential. Ambulation/Gait Ambulation/Gait Assistance: 4: Min guard Ambulation Distance (Feet): 15 Feet (times 2) Assistive device: Rolling walker Ambulation/Gait Assistance Details: generally steady with RW Gait Pattern: Step-through pattern Stairs: No    Exercises     PT Diagnosis: Generalized weakness  PT Problem List: Decreased  mobility;Decreased knowledge of use of DME;Decreased knowledge of precautions;Pain;Decreased strength PT Treatment Interventions: DME instruction;Gait training;Stair training;Functional mobility training;Therapeutic activities;Patient/family education     PT Goals(Current goals can be found in the care plan section) Acute Rehab PT Goals Patient Stated Goal: Get back to work PT Goal Formulation: With patient Time For Goal Achievement: 10/29/12 Potential to Achieve Goals: Good  Visit Information  Last PT Received On: 10/22/12 Assistance Needed: +1 History of Present Illness: Pt is s/p L2-5 laminectomy/posterolateral arthrodesis with bmp/cell saver. Pt is short in stature (4'2") and very pleasant.       Prior Functioning  Home Living Family/patient expects to be discharged to:: Private residence Living Arrangements: Spouse/significant other;Children;Parent Available Help at Discharge: Family Type of Home: House Home Access: Stairs to enter Secretary/administrator of Steps: 2 Entrance Stairs-Rails: Right;Left Home Layout: One level Home Equipment: None Additional Comments: tub shower and regular toilet Prior Function Level of Independence: Independent Communication Communication: No difficulties Dominant Hand: Right    Cognition  Cognition Arousal/Alertness: Awake/alert Behavior During Therapy: WFL for tasks assessed/performed Overall Cognitive Status: Within Functional Limits for tasks assessed    Extremity/Trunk Assessment Upper Extremity Assessment Upper Extremity Assessment: Defer to OT evaluation Lower Extremity Assessment Lower Extremity Assessment: Generalized weakness Cervical / Trunk Assessment Cervical / Trunk Assessment: Normal   Balance Balance Balance Assessed: No  End of Session PT - End of Session Activity Tolerance: Patient tolerated treatment well Patient left: in chair;with call bell/phone within reach;with family/visitor present Nurse Communication:  Mobility status  GP     Andra Heslin, Eliseo Gum 10/22/2012, 12:28 PM 10/22/2012  [place] Bing, PT [phone]  (pager)

## 2012-10-22 NOTE — Progress Notes (Signed)
UR COMPLETED  

## 2012-10-22 NOTE — Op Note (Signed)
NAMEJOHNANNA, BAKKE              ACCOUNT NO.:  [id number]  MEDICAL RECORD NO.:  [id number]  LOCATION:  [id number]                        FACILITY:  [facility]  PHYSICIAN:  Hilda Lias, M.D.   DATE OF BIRTH:  08/16/1945  DATE OF PROCEDURE:  10/21/2012 DATE OF DISCHARGE:                              OPERATIVE REPORT   PREOPERATIVE DIAGNOSES:  Lumbar stenosis with neurogenic claudication. Spondylolisthesis L3-4, L4-5 without movements.  POSTOPERATIVE DIAGNOSES:  Lumbar stenosis with neurogenic claudication. Spondylolisthesis L3-4, L4-5 without movements.  PROCEDURES:  Bilateral 2, 3, 4, 5 laminectomy.  Foraminotomy. Decompress the thecal sacral decubitus ulcer  posterolateral arthrodesis with autograft and Vitoss.  Microscope.  SURGEON:  Hilda Lias, M.D.  CLINICAL HISTORY:  Ms. Gatchell is a lady who is 43, 150 pounds who had been complaining of back pain with radiation to both lower extremities. X-ray show severe stenosis from L2-3 down to L4-5.  She has step-off at the level of 3-4, 4-5 which does not move with flexion-extension. Surgery was fully explained to her and her family, and the need for posterolateral arthrodesis to prevent worsening of the spondylolisthesis.  At the present time, she does move and there is no need for any hardware.  They knew of the risk of the surgery such as bleeding, infection, CSF leak, no improvement whatsoever, and need of further surgery.  PROCEDURE:  The patient was taken to the OR, and she was positioned in a prone manner.  Because of her size, we had to take down the gluteus and she has a quite a bit of hyperlordosis.  Then, the skin was cleaned with Betadine and later on with DuraPrep.  Midline incision was made in the skin and subcutaneous tissue, and muscle retracted laterally.  The most difficult part of the procedure was to localize by x-ray.  Patient has a large hip and it was difficult to see in a lateral view the area where we were.   We brought the C-arm and we did x-ray.  The radiologist called and involved.  At the end, we started removing the spinous process of 5, 4, 3, and 2.  The laminas were quite vertical.  Then using the drill as well as the 1, 2, and 3 mm Kerrison punch, we started to decompress the thecal sac.  This procedure took at least 2 hours because of the hypertrophy facet and thickening of the ligament.  Along the way, more x- ray were taken just to be sure that we were in the area.  Nevertheless at the end with the help of the microscope, we were able to go laterally and do the foraminotomy, then we have a good space in the epidural space above and below where we had the dissection.  Then, we went laterally and removed the periosteum of the lateral aspect of the facet of 2-3, 3- 4, 4-5, and 5-1.  Then, a mix of autograft and Vitoss were used for arthrodesis. There was a small opening where the patient had the spinal tap which was closed with a single stitch of 6-0 Prolene.  This landmark also help [place] to know where we were in during the surgery.  At the end, Hemovac was left  in the epidural space and the wound was closed with Vicryl and staples.          ______________________________ Hilda Lias, M.D.     EB/MEDQ  D:  10/21/2012  T:  10/22/2012  Job:  [id number]

## 2012-10-22 NOTE — Plan of Care (Signed)
Problem: Consults Goal: Diagnosis - Spinal Surgery Lumbar Laminectomy (Complex)     

## 2012-10-23 NOTE — Progress Notes (Signed)
Patient ID: Kaitlyn Barnes, female   DOB: April 20, 1945, 67 y.o.   MRN: [id number] C/o incisional pain, no weakness. No pain in legs as preop. hemovac out  Pt helping

## 2012-10-23 NOTE — Progress Notes (Signed)
Physical Therapy Treatment Patient Details Name: Kaitlyn Barnes MRN: [id number] DOB: [birth year] Today's Date: 10/23/2012 Time: 4098-1191 PT Time Calculation (min): 38 min  PT Assessment / Plan / Recommendation  PT Comments   Pt improving daily. She has verbalized understanding of back education and prec.  Could use HHPT home safety eval and treat as needed.   Follow Up Recommendations  Supervision for mobility/OOB;Home health PT     Does the patient have the potential to tolerate intense rehabilitation     Barriers to Discharge        Equipment Recommendations  Rolling walker with 5" wheels (Youth RW--pt is 4'2")    Recommendations for Other Services    Frequency Min 5X/week   Progress towards PT Goals Progress towards PT goals: Progressing toward goals  Plan Current plan remains appropriate    Precautions / Restrictions Precautions Precautions: Back Restrictions Weight Bearing Restrictions: No   Pertinent Vitals/Pain "not really hurting with pain meds."    Mobility  Bed Mobility Bed Mobility: Rolling Right;Right Sidelying to Sit;Sitting - Scoot to Edge of Bed Rolling Right: 5: Supervision;With rail Right Sidelying to Sit: 4: Min guard;With rails;HOB flat Sitting - Scoot to Edge of Bed: 5: Supervision Details for Bed Mobility Assistance: reinforced bed mobility. log roll Transfers Transfers: Sit to Stand;Stand to Sit Sit to Stand: 4: Min guard;With upper extremity assist;From bed;Other (comment) (with step) Stand to Sit: 4: Min guard;With upper extremity assist;To toilet;To bed Details for Transfer Assistance: vc's for hand placement-- use of stool is essential. Ambulation/Gait Ambulation/Gait Assistance: 5: Supervision Ambulation Distance (Feet): 150 Feet Assistive device: Rolling walker Ambulation/Gait Assistance Details: steady gait and able to vary speed. Gait Pattern: Step-through pattern Stairs: No    Exercises     PT Diagnosis:    PT Problem List:    PT Treatment Interventions:     PT Goals (current goals can now be found in the care plan section) Acute Rehab PT Goals Patient Stated Goal: Get back to work PT Goal Formulation: With patient Time For Goal Achievement: 10/29/12 Potential to Achieve Goals: Good  Visit Information  Last PT Received On: 10/23/12 Assistance Needed: +1 History of Present Illness: Pt is s/p L2-5 laminectomy/posterolateral arthrodesis with bmp/cell saver. Pt is short in stature (4'2") and very pleasant.    Subjective Data  Subjective: You really think I''m doing good? Patient Stated Goal: Get back to work   Cognition  Cognition Arousal/Alertness: Awake/alert Behavior During Therapy: WFL for tasks assessed/performed Overall Cognitive Status: Within Functional Limits for tasks assessed    Balance  Balance Balance Assessed: No  End of Session PT - End of Session Activity Tolerance: Patient tolerated treatment well Patient left: with call bell/phone within reach;with family/visitor present;in bed (sitting EOB) Nurse Communication: Mobility status   GP     Kaitlyn Barnes, Eliseo Gum 10/23/2012, 12:52 PM 10/23/2012  [place] Bing, PT [phone]  (pager)

## 2012-10-23 NOTE — Progress Notes (Signed)
I agree with the following treatment note after reviewing documentation.   Johnston, Meia Emley Brynn   OTR/L Pager: [phone] Office: [phone] .   

## 2012-10-23 NOTE — Care Management Note (Unsigned)
    Page 1 of 2   10/23/2012     3:31:50 PM   CARE MANAGEMENT NOTE 10/23/2012  Patient:  Kaitlyn Barnes, Kaitlyn Barnes   Account Number:  [id number]  Date Initiated:  10/22/2012  Documentation initiated by:  Jiles Crocker  Subjective/Objective Assessment:   ADMITTED FOR SURGERY - Decompression from l2 to l5 with PLA     Action/Plan:   LIVES WITH SPOUSE/ CM FOLLOWING FOR DC NEEDS   Anticipated DC Date:  10/29/2012   Anticipated DC Plan:  HOME W HOME HEALTH SERVICES      DC Planning Services  CM consult      Choice offered to / List presented to:  C-1 Patient   DME arranged  WALKER - YOUTH      DME agency  [facility]     HH arranged  HH-2 PT  HH-3 OT      [facility] agency  [facility]   Status of service:  Completed, signed off Medicare Important Message given?  NA - LOS <3 / Initial given by admissions (If response is "NO", the following Medicare IM given date fields will be blank) Date Medicare IM given:   Date Additional Medicare IM given:    Discharge Disposition:    Per UR Regulation:  Reviewed for med. necessity/level of care/duration of stay  If discussed at Long Length of Stay Meetings, dates discussed:    Comments:  10/23/12 1530 Elmer Bales RN, MSN, CM- Met with patient to discuss home health needs.  Pt chose to use [facility] for PT/OT. Mary with [facility] was notified of referral.  Order was placed for a youth rolling walker throught [facility] DME.    10/22/2012- B CHANDLER RN,BSN,MHA

## 2012-10-24 MED ORDER — OXYCODONE-ACETAMINOPHEN 5-325 MG PO TABS: 1.0000 | ORAL_TABLET | Freq: Four times a day (QID) | ORAL | Status: DC | PRN

## 2012-10-24 NOTE — Progress Notes (Signed)
Patient discharge home today, assessments remained unchanged as at now. Educated on the need to follow up after discharge and to call the MD when the need arises. Patient has to go home with the special walker for the hospital until she gets hers because her walker could not be delivered to her until Monday. Patient promise to bring that walker back as soon as she gets hers.

## 2012-10-24 NOTE — Discharge Summary (Signed)
Physician Discharge Summary  Patient ID: Kaitlyn Barnes MRN: [id number] DOB/AGE: February 03, 1946 67 y.o.  Admit date: 10/21/2012 Discharge date: 10/24/2012  Admission Diagnoses: Lumbar stenosis with instability    Discharge Diagnoses: Same   Discharged Condition: good  Hospital Course: The patient was admitted on 10/21/2012 and taken to the operating room where the patient underwent decompression and fusion. The patient tolerated the procedure well and was taken to the recovery room and then to the floor in stable condition. The hospital course was routine. There were no complications. The wound remained clean dry and intact. Pt had appropriate back soreness. No complaints of leg pain or new N/T/W. The patient remained afebrile with stable vital signs, and tolerated a regular diet. The patient continued to increase activities, and pain was well controlled with oral pain medications.   Consults: None  Significant Diagnostic Studies:  Results for orders placed during the hospital encounter of 10/21/12  CBC      Result Value Range   WBC 7.8  4.0 - 10.5 K/uL   RBC 3.96  3.87 - 5.11 MIL/uL   Hemoglobin 12.2  12.0 - 15.0 g/dL   HCT 40.9  81.1 - 91.4 %   MCV 92.9  78.0 - 100.0 fL   MCH 30.8  26.0 - 34.0 pg   MCHC 33.2  30.0 - 36.0 g/dL   RDW 78.2  95.6 - 21.3 %   Platelets 134 (*) 150 - 400 K/uL    Dg Chest 2 View  10/15/2012   *RADIOLOGY REPORT*  Clinical Data: Pre admission evaluation.  History of hypertension.  CHEST - 2 VIEW  Comparison: None.  Findings: Cardiac silhouette is normal size and shape.  There is slight ectasia of thoracic aorta.  No hilar enlargement is evident. Diaphragm is low-lying with slight flattening on lateral image which may reflect minimal hyperinflation.  No pulmonary infiltrative densities or masses were evident. No pleural abnormality is evident.  There is slight scoliosis convexity to the right.  There is slightly osteopenic appearance of bones.  Minimal  degenerative spondylosis is present.  IMPRESSION: Diaphragm is low-lying with slight flattening on lateral image which may reflect minimal hyperinflation.  No pulmonary edema, pneumonia, or other acute superimposed process is identified. Chronic findings detailed above.   Original Report Authenticated By: Onalee Hua Call   Dg Lumbar Spine 2-3 Views  10/21/2012   *RADIOLOGY REPORT*  Clinical Data: Achondroplastic dwarf.  Laminectomy.  LUMBAR SPINE - 2-3 VIEW  Comparison: CT myelogram 09/25/2012.  Multiple films earlier today.  Findings: Please note that three separate c-arm films are stacked within one image. It is therefore impossible to label one image without mislabeling the others.  The upper right angled probe overlies the L2-L3 foramen.  The lower right angled probe is located at the L5-S1 level.  IMPRESSION: As above.  I discussed these findings with Dr. Jeral Fruit at the time of interpretation.   Original Report Authenticated By: Davonna Belling, M.D.   Dg Lumbar Spine 2-3 Views  10/21/2012   *RADIOLOGY REPORT*  Clinical Data: L2-5 laminectomy.  LUMBAR SPINE - 2-3 VIEW  Comparison: 09/25/2012 post myelogram CT.  Findings: The patient has a transitional vertebra which on the post myelogram CT has been labeled S1.  Two-view films is submitted for review during surgery.  One is a full cross-table lateral view with the second view a C-arm view.  On the full length cross-table lateral view, evaluation is limited however, it appears that the superior clamp is at the L2  spinous process level with the inferior clamp posterior to the upper aspect of the L4 spinous process level.  The C-arm view is limited.  Utilizing the minimal anterior slip of L3 and L4 on the prior post myelogram CT examination, the superior probe appears to be at the L3-4 neural foraminal level with the inferior probe posterior to the L5-S1 disc space and upper S1 level.  To help confirm this, repeat imaging recommended.  Results discussed with Dr. Jeral Fruit  at the time of the examination.  IMPRESSION: The C-arm view is limited.  Utilizing the minimal anterior slip of L3 and L4 on the prior post myelogram CT examination, the superior probe appears to be at the L3-4 neural foraminal level with the inferior probe posterior to the L5-S1 disc space and upper S1 level.  To help confirm this, repeat imaging recommended.  Results discussed with Dr. Jeral Fruit at the time of the examination.   Original Report Authenticated By: Lacy Duverney, M.D.   Ct Lumbar Spine W Contrast  09/25/2012   *RADIOLOGY REPORT*  Clinical Data:  Low back pain.  Bilateral lower extremity numbness.  MYELOGRAM INJECTION  Technique:  Informed consent was obtained from the patient prior to the procedure, including potential complications of headache, allergy, infection and pain.  A timeout procedure was performed. With the patient prone, the lower back was prepped with Betadine. 1% Lidocaine was used for local anesthesia.  Lumbar puncture was performed at the left paramidline L2-3 level using a 22 gauge needle with return of clear CSF.  15 ml of Omnipaque in 180was injected into the subarachnoid space .  IMPRESSION: Successful injection of  intrathecal contrast for myelography.  MYELOGRAM LUMBAR  Technique: Radiologist injection Following injection of intrathecal Omnipaque contrast, spine imaging in multiple projections was performed using fluoroscopy.  Fluoroscopy Time: 3 minutes 12 seconds  Comparison:  None.  Findings: Five lumbar-type vertebral bodies are present.  Grade 1 anterolisthesis is evident at L3-4 and L4-5.  Leftward curvature centered at L2-3.  There is a significant filling defects suggesting high-grade stenosis at L2-3.  Moderate to severe stenoses are present at L3-4 and L4-5 as well.  Please see the CT report below for further details.  There is some contrast in the subdural space.  There is no significant change in position or alignment upon standing.  The anterolisthesis is stable with  flexion and extension.  There is no significant change in the extent of stenosis.  IMPRESSION:  1.  Moderate to severe stenoses at L2-3, L3-4, and L4-5. 2.  Grade 1 anterolisthesis at L3-4 and L4-5 without significant change during flexion or extension. 3.  Leftward curvature of the lumbar spine is centered at L2-3.  *RADIOLOGY REPORT*  CT MYELOGRAPHY LUMBAR SPINE  Technique:  CT imaging of the lumbar spine was performed after intrathecal contrast administration.  Multiplanar CT image reconstructions were also generated.  Findings:  The lumbar spine is imaged from midbody of T12 through S3-4.  Conus medullaris terminates at L1-2, within normal limits. Vertebral body heights are maintained.  Grade 1 anterolisthesis is more prominent at L4-5 and L5-S1.  There is a transitional S1 segment.  Leftward curvature of the lumbar spine is centered at L2- 3.  Limited imaging of the abdomen demonstrates minimal atherosclerotic calcifications without significant aneurysm.  T12-L1:  Mild facet hypertrophy is present.  There is no significant stenosis.  L1-2:  A broad-based disc herniation is present.  Mild facet hypertrophy is evident.  This results in mild lateral recess  and foraminal narrowing bilaterally.  L2-3:  A broad-based disc herniation is present.  Advanced facet hypertrophy and ligamentum flavum thickening is present.  The pedicles are short.  This results in moderate central and bilateral foraminal stenosis.  L3-4:  A broad-based disc herniation, moderate facet hypertrophy and spurring leads to severe central canal stenosis.  Moderate to severe foraminal stenosis is present bilaterally as well.  L4-5:  A broad-based disc herniation is present.  Advanced facet hypertrophy is present with a vacuum disc bilaterally.  Marked facet spurring significantly reduces the transverse diameter of the canal.  There is severe central canal stenosis.  Mild moderate foraminal narrowing is present bilaterally.  L5-S1:  A broad-based  disc herniation is present.  Facet hypertrophy and spurring is noted bilaterally.  Moderate to lateral recess narrowing is present.  Mild to moderate to foraminal narrowing bilaterally is secondary to facet spurring, particularly on the right.  IMPRESSION:  1.  Grade 1 anterolisthesis at L3-4 and L4-5. 2. Prominent disc herniations the short pedicles, and facet hypertrophy/spurring results in moderate to severe central canal stenosis at L2-3, L3, and L4-5. 3.  Moderate to lateral recess narrowing at L5-S1 is slightly less severe. 4.  Bilateral foraminal narrowing is present throughout the lumbar spine as described.   Original Report Authenticated By: Marin Roberts, M.D.   Dg Myelogram Lumbar  09/25/2012   *RADIOLOGY REPORT*  Clinical Data:  Low back pain.  Bilateral lower extremity numbness.  MYELOGRAM INJECTION  Technique:  Informed consent was obtained from the patient prior to the procedure, including potential complications of headache, allergy, infection and pain.  A timeout procedure was performed. With the patient prone, the lower back was prepped with Betadine. 1% Lidocaine was used for local anesthesia.  Lumbar puncture was performed at the left paramidline L2-3 level using a 22 gauge needle with return of clear CSF.  15 ml of Omnipaque in 180was injected into the subarachnoid space .  IMPRESSION: Successful injection of  intrathecal contrast for myelography.  MYELOGRAM LUMBAR  Technique: Radiologist injection Following injection of intrathecal Omnipaque contrast, spine imaging in multiple projections was performed using fluoroscopy.  Fluoroscopy Time: 3 minutes 12 seconds  Comparison:  None.  Findings: Five lumbar-type vertebral bodies are present.  Grade 1 anterolisthesis is evident at L3-4 and L4-5.  Leftward curvature centered at L2-3.  There is a significant filling defects suggesting high-grade stenosis at L2-3.  Moderate to severe stenoses are present at L3-4 and L4-5 as well.  Please see  the CT report below for further details.  There is some contrast in the subdural space.  There is no significant change in position or alignment upon standing.  The anterolisthesis is stable with flexion and extension.  There is no significant change in the extent of stenosis.  IMPRESSION:  1.  Moderate to severe stenoses at L2-3, L3-4, and L4-5. 2.  Grade 1 anterolisthesis at L3-4 and L4-5 without significant change during flexion or extension. 3.  Leftward curvature of the lumbar spine is centered at L2-3.  *RADIOLOGY REPORT*  CT MYELOGRAPHY LUMBAR SPINE  Technique:  CT imaging of the lumbar spine was performed after intrathecal contrast administration.  Multiplanar CT image reconstructions were also generated.  Findings:  The lumbar spine is imaged from midbody of T12 through S3-4.  Conus medullaris terminates at L1-2, within normal limits. Vertebral body heights are maintained.  Grade 1 anterolisthesis is more prominent at L4-5 and L5-S1.  There is a transitional S1 segment.  Leftward curvature  of the lumbar spine is centered at L2- 3.  Limited imaging of the abdomen demonstrates minimal atherosclerotic calcifications without significant aneurysm.  T12-L1:  Mild facet hypertrophy is present.  There is no significant stenosis.  L1-2:  A broad-based disc herniation is present.  Mild facet hypertrophy is evident.  This results in mild lateral recess and foraminal narrowing bilaterally.  L2-3:  A broad-based disc herniation is present.  Advanced facet hypertrophy and ligamentum flavum thickening is present.  The pedicles are short.  This results in moderate central and bilateral foraminal stenosis.  L3-4:  A broad-based disc herniation, moderate facet hypertrophy and spurring leads to severe central canal stenosis.  Moderate to severe foraminal stenosis is present bilaterally as well.  L4-5:  A broad-based disc herniation is present.  Advanced facet hypertrophy is present with a vacuum disc bilaterally.  Marked  facet spurring significantly reduces the transverse diameter of the canal.  There is severe central canal stenosis.  Mild moderate foraminal narrowing is present bilaterally.  L5-S1:  A broad-based disc herniation is present.  Facet hypertrophy and spurring is noted bilaterally.  Moderate to lateral recess narrowing is present.  Mild to moderate to foraminal narrowing bilaterally is secondary to facet spurring, particularly on the right.  IMPRESSION:  1.  Grade 1 anterolisthesis at L3-4 and L4-5. 2. Prominent disc herniations the short pedicles, and facet hypertrophy/spurring results in moderate to severe central canal stenosis at L2-3, L3, and L4-5. 3.  Moderate to lateral recess narrowing at L5-S1 is slightly less severe. 4.  Bilateral foraminal narrowing is present throughout the lumbar spine as described.   Original Report Authenticated By: Marin Roberts, M.D.   Dg Lumbar Spine 1 View  10/21/2012   *RADIOLOGY REPORT*  Clinical Data: L2-L5 laminectomies.  LUMBAR SPINE - 1 VIEW  Comparison: Lumbar myelogram CT 09/25/2012.  Findings: Cross-table lateral view at 1255 hours is labeled #2. (An examination obtained at 1300 hours is also labeled #2.)  S1 is transitional.  Surgical instruments overlie the L2 and L4 spinous processes.  There is a slight anterolisthesis at the L3-L4 and L4-L5 levels.  IMPRESSION: Intraoperative localization as described.   Original Report Authenticated By: Carey Bullocks, M.D.    Antibiotics:  Anti-infectives   Start     Dose/Rate Route Frequency Ordered Stop   10/21/12 2000  ceFAZolin (ANCEF) IVPB 1 g/50 mL premix     1 g 100 mL/hr over 30 Minutes Intravenous Every 8 hours 10/21/12 1901 10/22/12 0354   10/21/12 0600  ceFAZolin (ANCEF) IVPB 2 g/50 mL premix     2 g 100 mL/hr over 30 Minutes Intravenous On call to O.R. 10/20/12 1448 10/21/12 1214      Discharge Exam: Blood pressure 109/47, pulse 80, temperature 99.3 F (37.4 C), temperature source Oral, resp. rate  15, height 4\' 2"  (1.27 m), weight 68.584 kg (151 lb 3.2 oz), SpO2 98.00%. Neurologic: Grossly normal Incision clean dry and intact  Discharge Medications:     Medication List    STOP taking these medications       meloxicam 7.5 MG tablet  Commonly known as:  MOBIC      TAKE these medications       amLODipine-benazepril 10-20 MG per capsule  Commonly known as:  LOTREL  Take 1 capsule by mouth daily.     atorvastatin 20 MG tablet  Commonly known as:  LIPITOR  Take 20 mg by mouth daily.     fexofenadine 180 MG tablet  Commonly known  as:  ALLEGRA  Take 180 mg by mouth daily.     levothyroxine 100 MCG tablet  Commonly known as:  SYNTHROID, LEVOTHROID  Take 100 mcg by mouth daily.     MUCINEX PO  Take 1 tablet by mouth daily.     omeprazole 20 MG capsule  Commonly known as:  PRILOSEC  Take 20 mg by mouth daily.     oxyCODONE-acetaminophen 5-325 MG per tablet  Commonly known as:  PERCOCET/ROXICET  Take 1-2 tablets by mouth every 6 (six) hours as needed for pain.     potassium chloride 10 MEQ tablet  Commonly known as:  K-DUR  Take 20 mEq by mouth 2 (two) times daily.        Disposition: Home  Final Dx: Lumbar decompression and fusion      Discharge Orders   Future Orders Complete By Expires     Call MD for:  difficulty breathing, headache or visual disturbances  As directed     Call MD for:  persistant nausea and vomiting  As directed     Call MD for:  redness, tenderness, or signs of infection (pain, swelling, redness, odor or green/yellow discharge around incision site)  As directed     Call MD for:  severe uncontrolled pain  As directed     Call MD for:  temperature >100.4  As directed     Diet - low sodium heart healthy  As directed     Discharge instructions  As directed     Comments:      No heavy lifting or bending or twisting, no driving    Increase activity slowly  As directed     Remove dressing in 24 hours  As directed        Follow-up  Information   Follow up with Karn Cassis, MD. Schedule an appointment as soon as possible for a visit in 2 weeks.   Contact information:   [address] [place] [address] [phone]        Signed: Tia Alert 10/24/2012, 8:13 AM

## 2012-10-24 NOTE — Progress Notes (Signed)
   CARE MANAGEMENT NOTE 10/24/2012  Patient:  Kaitlyn Barnes, Kaitlyn Barnes   Account Number:  [id number]  Date Initiated:  10/22/2012  Documentation initiated by:  Jiles Crocker  Subjective/Objective Assessment:   ADMITTED FOR SURGERY - Decompression from l2 to l5 with PLA     Action/Plan:   LIVES WITH SPOUSE/ CM FOLLOWING FOR DC NEEDS   Anticipated DC Date:  10/29/2012   Anticipated DC Plan:  HOME W HOME HEALTH SERVICES      DC Planning Services  CM consult      Choice offered to / List presented to:  C-1 Patient   DME arranged  [facility] - [facility]      DME agency  [facility]     HH arranged  HH-2 PT  HH-3 OT      [facility] agency  [facility]   Status of service:  Completed, signed off Medicare Important Message given?  NA - LOS <3 / Initial given by admissions (If response is "NO", the following Medicare IM given date fields will be blank) Date Medicare IM given:   Date Additional Medicare IM given:    Discharge Disposition:    Per UR Regulation:  Reviewed for med. necessity/level of care/duration of stay  If discussed at Long Length of Stay Meetings, dates discussed:    Comments:  10/24/2012 1500 NCM faxed referral to [facility] for scheduled dc home today. Faxed referral for pediatric RW to [facility]. Plan to deliver to home on Monday. Discharged home with loaner RW until she receives her RW. Pt's family to return loaner RW. Isidoro Donning RN CCM Case Mgmt phone [phone]  10/23/12 1530 Elmer Bales RN, MSN, CM- Met with patient to discuss home health needs.  Pt chose to use [facility] for PT/OT. Mary with [facility] was notified of referral.  Order was placed for a youth rolling walker throught [facility] DME.    10/22/2012- B CHANDLER RN,BSN,MHA

## 2012-10-24 NOTE — Progress Notes (Signed)
Physical Therapy Treatment Patient Details Name: Kaitlyn Barnes MRN: [id number] DOB: 01/24/1946 Today's Date: 10/24/2012 Time: 1010-1020 PT Time Calculation (min): 10 min  PT Assessment / Plan / Recommendation  PT Comments   Pt deferring out of bed today due to pain. Focused on self care/home management with back precautions and posture/bed positioning for adherence to back precautions and general body mechanics with ADL's/mobility/household tasks. Handouts provided with education.    Follow Up Recommendations  Supervision for mobility/OOB;Home health PT           Equipment Recommendations  Rolling walker with 5" wheels       Frequency Min 5X/week   Progress towards PT Goals Progress towards PT goals: Progressing toward goals  Plan Current plan remains appropriate    Precautions / Restrictions Precautions Precautions: Back Precaution Comments: Pt re-educated on 3/3 back precautions (only able to recall 2/3 without assist/cues). Provided pt handout on back precautions, positioning and posture/body mechanics with daily activities to adhere to precautions and decr risk of future back injury. Restrictions Weight Bearing Restrictions: No       Mobility  Bed Mobility Bed Mobility: Not assessed (pt deferred mobility due to leaving and in too much pain now) Transfers Transfers: Not assessed Ambulation/Gait Ambulation/Gait Assistance: Not tested (comment)      PT Goals (current goals can now be found in the care plan section) Acute Rehab PT Goals Patient Stated Goal: Get back to work PT Goal Formulation: With patient Time For Goal Achievement: 10/29/12 Potential to Achieve Goals: Good  Visit Information  Last PT Received On: 10/24/12 Assistance Needed: +1 History of Present Illness: Pt is s/p L2-5 laminectomy/posterolateral arthrodesis with bmp/cell saver. Pt is short in stature (4'2") and very pleasant.    Subjective Data  Patient Stated Goal: Get back to work    Cognition  Cognition Arousal/Alertness: Awake/alert Behavior During Therapy: WFL for tasks assessed/performed Overall Cognitive Status: Within Functional Limits for tasks assessed       End of Session PT - End of Session Activity Tolerance: Patient limited by pain Patient left: in bed;with call bell/phone within reach;with nursing/sitter in room Nurse Communication: Patient requests pain meds   GP     Sallyanne Kuster 10/24/2012, 11:56 AM

## 2012-10-24 NOTE — Plan of Care (Signed)
Problem: Consults Goal: Diagnosis - Spinal Surgery Outcome: Completed/Met Date Met:  10/24/12 Lumbar Laminectomy (Complex)

## 2012-10-24 NOTE — Progress Notes (Signed)
Orthopedic Tech Progress Note Patient Details:  Kaitlyn Barnes [birth year] [id number]  Patient ID: [name], female   DOB: August 11, 1945, 67 y.o.   MRN: [id number]   Shawnie Pons 10/24/2012, 9:45 AM Called [employer] for lumbar corset.

## 2013-09-21 ENCOUNTER — Encounter: Payer: Self-pay | Admitting: Gastroenterology

## 2013-12-01 ENCOUNTER — Encounter: Payer: Self-pay | Admitting: Gastroenterology

## 2013-12-01 ENCOUNTER — Other Ambulatory Visit (HOSPITAL_COMMUNITY)
Admission: RE | Admit: 2013-12-01 | Discharge: 2013-12-01 | Disposition: A | Discharge status: Home / Self Care | Payer: 59 | Source: Ambulatory Visit | Attending: Internal Medicine | Admitting: Internal Medicine

## 2013-12-01 DIAGNOSIS — Z01419 Encounter for gynecological examination (general) (routine) without abnormal findings: Secondary | ICD-10-CM | POA: Insufficient documentation

## 2014-02-03 ENCOUNTER — Encounter: Payer: 59 | Admitting: Gastroenterology

## 2014-02-04 ENCOUNTER — Ambulatory Visit (AMBULATORY_SURGERY_CENTER): Payer: Self-pay | Admitting: *Deleted

## 2014-02-04 VITALS — Ht <= 58 in | Wt 148.0 lb

## 2014-02-04 DIAGNOSIS — Z1211 Encounter for screening for malignant neoplasm of colon: Secondary | ICD-10-CM

## 2014-02-04 MED ORDER — MOVIPREP 100 G PO SOLR: ORAL | Status: DC

## 2014-02-04 NOTE — Progress Notes (Signed)
Patient denies any allergies to eggs or soy. Patient denies any problems with anesthesia/sedation. Pt does not remember n&v after any surgeries. No problems from last sx, back sx 2014. Patient denies any oxygen use at home and does not take any diet/weight loss medications. EMMI education assisgned to patient on colonoscopy, this was explained and instructions given to patient.

## 2014-02-10 ENCOUNTER — Encounter: Payer: Self-pay | Admitting: Gastroenterology

## 2014-02-18 ENCOUNTER — Ambulatory Visit (AMBULATORY_SURGERY_CENTER): Payer: 59 | Admitting: Gastroenterology

## 2014-02-18 ENCOUNTER — Encounter: Payer: 59 | Admitting: Gastroenterology

## 2014-02-18 ENCOUNTER — Encounter: Payer: Self-pay | Admitting: Gastroenterology

## 2014-02-18 VITALS — BP 102/53 | HR 70 | Temp 99.0°F | Resp 17 | Ht <= 58 in | Wt 148.0 lb

## 2014-02-18 DIAGNOSIS — D122 Benign neoplasm of ascending colon: Secondary | ICD-10-CM

## 2014-02-18 DIAGNOSIS — Z1211 Encounter for screening for malignant neoplasm of colon: Secondary | ICD-10-CM

## 2014-02-18 MED ORDER — SODIUM CHLORIDE 0.9 % IV SOLN: 500.0000 mL | INTRAVENOUS | Status: DC

## 2014-02-18 NOTE — Op Note (Signed)
[facility]  [address] [place], [address]   COLONOSCOPY PROCEDURE REPORT  PATIENT: Kaitlyn Barnes, Kaitlyn Barnes  MR#: [id number] BIRTHDATE: Dec 05, 1945 , 54  yrs. old GENDER: female ENDOSCOPIST: Ladene Artist, MD, [facility] REFERRED TY:OMAYO, [place] PROCEDURE DATE:  02/18/2014 PROCEDURE:   Colonoscopy with snare polypectomy First Screening Colonoscopy - Avg.  risk and is 50 yrs.  old or older - No.  Prior Negative Screening - Now for repeat screening. 10 or more years since last screening  History of Adenoma - Now for follow-up colonoscopy & has been > or = to 3 yrs.  N/A  Polyps Removed Today? Yes. ASA CLASS:   Class III INDICATIONS:average risk for colorectal cancer. MEDICATIONS: Monitored anesthesia care and Propofol 150 mg IV DESCRIPTION OF PROCEDURE:   After the risks benefits and alternatives of the procedure were thoroughly explained, informed consent was obtained.  The digital rectal exam revealed no abnormalities of the rectum.   The LB [id number]  endoscope was introduced through the anus and advanced to the cecum, which was identified by both the appendix and ileocecal valve. No adverse events experienced.   The quality of the prep was good, using MoviPrep  The instrument was then slowly withdrawn as the colon was fully examined.  COLON FINDINGS: A sessile polyp measuring 6 mm in size was found in the ascending colon.  A polypectomy was performed with a cold snare.  The resection was complete, the polyp tissue was completely retrieved and sent to histology.   There was mild diverticulosis noted in the sigmoid colon.   The examination was otherwise normal. Retroflexed views revealed no abnormalities. The time to cecum=1 minutes 12 seconds.  Withdrawal time=10 minutes 38 seconds.  The scope was withdrawn and the procedure completed.  COMPLICATIONS: There were no immediate complications.  ENDOSCOPIC IMPRESSION: 1.   Sessile polyp in the ascending colon;  polypectomy performed with a cold snare 2.   Mild diverticulosis in the sigmoid colon 3.   The examination was otherwise normal  RECOMMENDATIONS: 1.  Await pathology results 2.  Repeat colonoscopy in 5 years if polyp adenomatous; otherwise 10 years 3.  High fiber diet with liberal fluid intake.  eSigned:  Ladene Artist, MD, [facility] 02/18/2014 8:52 AM

## 2014-02-18 NOTE — Progress Notes (Signed)
Called to room to assist during endoscopic procedure.  Patient ID and intended procedure confirmed with present staff. Received instructions for my participation in the procedure from the performing physician.  

## 2014-02-18 NOTE — Progress Notes (Signed)
Report to PACU, RN, vss, BBS= Clear.  

## 2014-02-18 NOTE — Patient Instructions (Signed)
Impressions/recommendations:  Polyp (handout given) Diverticulosis (handout given) High Fiber diet (handout given)  Repeat colonoscopy pending pathology results.  YOU HAD AN ENDOSCOPIC PROCEDURE TODAY AT [facility]: Refer to the procedure report that was given to you for any specific questions about what was found during the examination.  If the procedure report does not answer your questions, please call your gastroenterologist to clarify.  If you requested that your care partner not be given the details of your procedure findings, then the procedure report has been included in a sealed envelope for you to review at your convenience later.  YOU SHOULD EXPECT: Some feelings of bloating in the abdomen. Passage of more gas than usual.  Walking can help get rid of the air that was put into your GI tract during the procedure and reduce the bloating. If you had a lower endoscopy (such as a colonoscopy or flexible sigmoidoscopy) you may notice spotting of blood in your stool or on the toilet paper. If you underwent a bowel prep for your procedure, then you may not have a normal bowel movement for a few days.  DIET: Your first meal following the procedure should be a light meal and then it is ok to progress to your normal diet.  A half-sandwich or bowl of soup is an example of a good first meal.  Heavy or fried foods are harder to digest and may make you feel nauseous or bloated.  Likewise meals heavy in dairy and vegetables can cause extra gas to form and this can also increase the bloating.  Drink plenty of fluids but you should avoid alcoholic beverages for 24 hours.  ACTIVITY: Your care partner should take you home directly after the procedure.  You should plan to take it easy, moving slowly for the rest of the day.  You can resume normal activity the day after the procedure however you should NOT DRIVE or use heavy machinery for 24 hours (because of the sedation medicines used during  the test).    SYMPTOMS TO REPORT IMMEDIATELY: A gastroenterologist can be reached at any hour.  During normal business hours, 8:30 AM to 5:00 PM Monday through Friday, call [phone].  After hours and on weekends, please call the GI answering service at [phone] who will take a message and have the physician on call contact you.   Following lower endoscopy (colonoscopy or flexible sigmoidoscopy):  Excessive amounts of blood in the stool  Significant tenderness or worsening of abdominal pains  Swelling of the abdomen that is new, acute  Fever of 100F or higher  FOLLOW UP: If any biopsies were taken you will be contacted by phone or by letter within the next 1-3 weeks.  Call your gastroenterologist if you have not heard about the biopsies in 3 weeks.  Our staff will call the home number listed on your records the next business day following your procedure to check on you and address any questions or concerns that you may have at that time regarding the information given to you following your procedure. This is a courtesy call and so if there is no answer at the home number and we have not heard from you through the emergency physician on call, we will assume that you have returned to your regular daily activities without incident.  SIGNATURES/CONFIDENTIALITY: You and/or your care partner have signed paperwork which will be entered into your electronic medical record.  These signatures attest to the fact that that the information  above on your After Visit Summary has been reviewed and is understood.  Full responsibility of the confidentiality of this discharge information lies with you and/or your care-partner. 

## 2014-02-21 ENCOUNTER — Telehealth: Payer: Self-pay | Admitting: *Deleted

## 2014-02-21 NOTE — Telephone Encounter (Signed)
  Follow up Call-  Call back number 02/18/2014  Post procedure Call Back phone  # [phone]  Permission to leave phone message Yes     Patient questions:  Do you have a fever, pain , or abdominal swelling? No. Pain Score  0 *  Have you tolerated food without any problems? Yes.    Have you been able to return to your normal activities? Yes.    Do you have any questions about your discharge instructions: Diet   No. Medications  No. Follow up visit  No.  Do you have questions or concerns about your Care? No.  Actions: * If pain score is 4 or above: No action needed, pain <4.

## 2014-02-23 ENCOUNTER — Encounter: Payer: Self-pay | Admitting: Gastroenterology

## 2015-10-16 ENCOUNTER — Telehealth: Payer: Self-pay | Admitting: Pulmonary Disease

## 2015-10-16 NOTE — Telephone Encounter (Signed)
Error

## 2016-12-11 DIAGNOSIS — I10 Essential (primary) hypertension: Secondary | ICD-10-CM | POA: Diagnosis not present

## 2016-12-11 DIAGNOSIS — E039 Hypothyroidism, unspecified: Secondary | ICD-10-CM | POA: Diagnosis not present

## 2017-01-03 DIAGNOSIS — H40013 Open angle with borderline findings, low risk, bilateral: Secondary | ICD-10-CM | POA: Diagnosis not present

## 2017-01-03 DIAGNOSIS — H25013 Cortical age-related cataract, bilateral: Secondary | ICD-10-CM | POA: Diagnosis not present

## 2017-01-03 DIAGNOSIS — H2513 Age-related nuclear cataract, bilateral: Secondary | ICD-10-CM | POA: Diagnosis not present

## 2017-01-03 DIAGNOSIS — H40033 Anatomical narrow angle, bilateral: Secondary | ICD-10-CM | POA: Diagnosis not present

## 2017-01-03 DIAGNOSIS — H524 Presbyopia: Secondary | ICD-10-CM | POA: Diagnosis not present

## 2017-04-02 DIAGNOSIS — E039 Hypothyroidism, unspecified: Secondary | ICD-10-CM | POA: Diagnosis not present

## 2017-04-02 DIAGNOSIS — I10 Essential (primary) hypertension: Secondary | ICD-10-CM | POA: Diagnosis not present

## 2017-04-02 DIAGNOSIS — M150 Primary generalized (osteo)arthritis: Secondary | ICD-10-CM | POA: Diagnosis not present

## 2017-04-02 DIAGNOSIS — E876 Hypokalemia: Secondary | ICD-10-CM | POA: Diagnosis not present

## 2017-07-07 DIAGNOSIS — Q774 Achondroplasia: Secondary | ICD-10-CM | POA: Diagnosis not present

## 2017-07-07 DIAGNOSIS — I10 Essential (primary) hypertension: Secondary | ICD-10-CM | POA: Diagnosis not present

## 2017-07-07 DIAGNOSIS — E559 Vitamin D deficiency, unspecified: Secondary | ICD-10-CM | POA: Diagnosis not present

## 2017-07-07 DIAGNOSIS — E039 Hypothyroidism, unspecified: Secondary | ICD-10-CM | POA: Diagnosis not present

## 2017-07-07 DIAGNOSIS — R42 Dizziness and giddiness: Secondary | ICD-10-CM | POA: Diagnosis not present

## 2017-07-07 DIAGNOSIS — E876 Hypokalemia: Secondary | ICD-10-CM | POA: Diagnosis not present

## 2017-10-08 DIAGNOSIS — K5909 Other constipation: Secondary | ICD-10-CM | POA: Diagnosis not present

## 2017-10-08 DIAGNOSIS — Q774 Achondroplasia: Secondary | ICD-10-CM | POA: Diagnosis not present

## 2017-10-08 DIAGNOSIS — E039 Hypothyroidism, unspecified: Secondary | ICD-10-CM | POA: Diagnosis not present

## 2017-10-08 DIAGNOSIS — I10 Essential (primary) hypertension: Secondary | ICD-10-CM | POA: Diagnosis not present

## 2017-10-08 DIAGNOSIS — E559 Vitamin D deficiency, unspecified: Secondary | ICD-10-CM | POA: Diagnosis not present

## 2017-10-08 DIAGNOSIS — E876 Hypokalemia: Secondary | ICD-10-CM | POA: Diagnosis not present

## 2017-10-08 DIAGNOSIS — D510 Vitamin B12 deficiency anemia due to intrinsic factor deficiency: Secondary | ICD-10-CM | POA: Diagnosis not present

## 2018-01-06 DIAGNOSIS — H40033 Anatomical narrow angle, bilateral: Secondary | ICD-10-CM | POA: Diagnosis not present

## 2018-01-06 DIAGNOSIS — H40013 Open angle with borderline findings, low risk, bilateral: Secondary | ICD-10-CM | POA: Diagnosis not present

## 2018-01-06 DIAGNOSIS — H25013 Cortical age-related cataract, bilateral: Secondary | ICD-10-CM | POA: Diagnosis not present

## 2018-01-06 DIAGNOSIS — H2513 Age-related nuclear cataract, bilateral: Secondary | ICD-10-CM | POA: Diagnosis not present

## 2018-01-08 DIAGNOSIS — D519 Vitamin B12 deficiency anemia, unspecified: Secondary | ICD-10-CM | POA: Diagnosis not present

## 2018-01-08 DIAGNOSIS — Q774 Achondroplasia: Secondary | ICD-10-CM | POA: Diagnosis not present

## 2018-01-08 DIAGNOSIS — E876 Hypokalemia: Secondary | ICD-10-CM | POA: Diagnosis not present

## 2018-01-08 DIAGNOSIS — E559 Vitamin D deficiency, unspecified: Secondary | ICD-10-CM | POA: Diagnosis not present

## 2018-01-08 DIAGNOSIS — I10 Essential (primary) hypertension: Secondary | ICD-10-CM | POA: Diagnosis not present

## 2018-01-08 DIAGNOSIS — E039 Hypothyroidism, unspecified: Secondary | ICD-10-CM | POA: Diagnosis not present

## 2018-01-08 DIAGNOSIS — Z23 Encounter for immunization: Secondary | ICD-10-CM | POA: Diagnosis not present

## 2018-01-30 DIAGNOSIS — E559 Vitamin D deficiency, unspecified: Secondary | ICD-10-CM | POA: Diagnosis not present

## 2018-01-30 DIAGNOSIS — E039 Hypothyroidism, unspecified: Secondary | ICD-10-CM | POA: Diagnosis not present

## 2018-02-04 DIAGNOSIS — E559 Vitamin D deficiency, unspecified: Secondary | ICD-10-CM | POA: Diagnosis not present

## 2018-02-04 DIAGNOSIS — E039 Hypothyroidism, unspecified: Secondary | ICD-10-CM | POA: Diagnosis not present

## 2018-02-04 DIAGNOSIS — E876 Hypokalemia: Secondary | ICD-10-CM | POA: Diagnosis not present

## 2018-02-04 DIAGNOSIS — I10 Essential (primary) hypertension: Secondary | ICD-10-CM | POA: Diagnosis not present

## 2018-03-30 DIAGNOSIS — E039 Hypothyroidism, unspecified: Secondary | ICD-10-CM | POA: Diagnosis not present

## 2019-01-27 ENCOUNTER — Encounter: Payer: Self-pay | Admitting: Gastroenterology

## 2019-05-25 ENCOUNTER — Other Ambulatory Visit: Payer: Self-pay | Admitting: Internal Medicine

## 2019-05-25 DIAGNOSIS — Z1231 Encounter for screening mammogram for malignant neoplasm of breast: Secondary | ICD-10-CM

## 2019-05-25 DIAGNOSIS — Z13820 Encounter for screening for osteoporosis: Secondary | ICD-10-CM

## 2019-05-26 ENCOUNTER — Other Ambulatory Visit: Payer: Self-pay | Admitting: Internal Medicine

## 2019-05-26 DIAGNOSIS — R590 Localized enlarged lymph nodes: Secondary | ICD-10-CM

## 2019-06-15 ENCOUNTER — Ambulatory Visit
Admission: RE | Admit: 2019-06-15 | Discharge: 2019-06-15 | Disposition: A | Discharge status: Home / Self Care | Payer: Medicare HMO | Source: Ambulatory Visit | Attending: Internal Medicine | Admitting: Internal Medicine

## 2019-06-15 DIAGNOSIS — R590 Localized enlarged lymph nodes: Secondary | ICD-10-CM

## 2019-08-11 ENCOUNTER — Ambulatory Visit: Payer: Self-pay

## 2019-08-11 ENCOUNTER — Other Ambulatory Visit: Payer: Self-pay

## 2019-10-13 ENCOUNTER — Ambulatory Visit
Admission: RE | Admit: 2019-10-13 | Discharge: 2019-10-13 | Disposition: A | Discharge status: Home / Self Care | Payer: Medicare HMO | Source: Ambulatory Visit | Attending: Internal Medicine | Admitting: Internal Medicine

## 2019-10-13 ENCOUNTER — Other Ambulatory Visit: Payer: Self-pay

## 2019-10-13 DIAGNOSIS — Z13820 Encounter for screening for osteoporosis: Secondary | ICD-10-CM

## 2019-10-13 DIAGNOSIS — Z1231 Encounter for screening mammogram for malignant neoplasm of breast: Secondary | ICD-10-CM

## 2020-01-30 ENCOUNTER — Emergency Department (HOSPITAL_COMMUNITY)
Admission: EM | Admit: 2020-01-30 | Discharge: 2020-01-30 | Disposition: A | Discharge status: Home / Self Care | Payer: Medicare HMO | Attending: Emergency Medicine | Admitting: Emergency Medicine

## 2020-01-30 ENCOUNTER — Other Ambulatory Visit: Payer: Self-pay

## 2020-01-30 ENCOUNTER — Emergency Department (HOSPITAL_BASED_OUTPATIENT_CLINIC_OR_DEPARTMENT_OTHER): Payer: Medicare HMO

## 2020-01-30 DIAGNOSIS — I82422 Acute embolism and thrombosis of left iliac vein: Secondary | ICD-10-CM

## 2020-01-30 DIAGNOSIS — R609 Edema, unspecified: Secondary | ICD-10-CM

## 2020-01-30 DIAGNOSIS — I10 Essential (primary) hypertension: Secondary | ICD-10-CM | POA: Insufficient documentation

## 2020-01-30 DIAGNOSIS — M79662 Pain in left lower leg: Secondary | ICD-10-CM | POA: Diagnosis present

## 2020-01-30 DIAGNOSIS — M79609 Pain in unspecified limb: Secondary | ICD-10-CM

## 2020-01-30 DIAGNOSIS — E039 Hypothyroidism, unspecified: Secondary | ICD-10-CM | POA: Diagnosis not present

## 2020-01-30 DIAGNOSIS — Z79899 Other long term (current) drug therapy: Secondary | ICD-10-CM | POA: Diagnosis not present

## 2020-01-30 DIAGNOSIS — F17210 Nicotine dependence, cigarettes, uncomplicated: Secondary | ICD-10-CM | POA: Insufficient documentation

## 2020-01-30 LAB — CBC WITH DIFFERENTIAL/PLATELET
Abs Immature Granulocytes: 0 10*3/uL (ref 0.00–0.07)
Basophils Absolute: 0 10*3/uL (ref 0.0–0.1)
Basophils Relative: 0 %
Eosinophils Absolute: 0 10*3/uL (ref 0.0–0.5)
Eosinophils Relative: 0 %
HCT: 44.3 % (ref 36.0–46.0)
Hemoglobin: 15 g/dL (ref 12.0–15.0)
Immature Granulocytes: 0 %
Lymphocytes Relative: 24 %
Lymphs Abs: 1.1 10*3/uL (ref 0.7–4.0)
MCH: 37.2 pg — ABNORMAL HIGH (ref 26.0–34.0)
MCHC: 33.9 g/dL (ref 30.0–36.0)
MCV: 109.9 fL — ABNORMAL HIGH (ref 80.0–100.0)
Monocytes Absolute: 0.4 10*3/uL (ref 0.1–1.0)
Monocytes Relative: 9 %
Neutro Abs: 3.1 10*3/uL (ref 1.7–7.7)
Neutrophils Relative %: 67 %
Platelets: 56 10*3/uL — ABNORMAL LOW (ref 150–400)
RBC: 4.03 MIL/uL (ref 3.87–5.11)
RDW: 15.4 % (ref 11.5–15.5)
WBC: 4.7 10*3/uL (ref 4.0–10.5)
nRBC: 0 % (ref 0.0–0.2)

## 2020-01-30 LAB — BASIC METABOLIC PANEL
Anion gap: 11 (ref 5–15)
BUN: 11 mg/dL (ref 8–23)
CO2: 21 mmol/L — ABNORMAL LOW (ref 22–32)
Calcium: 9 mg/dL (ref 8.9–10.3)
Chloride: 108 mmol/L (ref 98–111)
Creatinine, Ser: 0.77 mg/dL (ref 0.44–1.00)
GFR, Estimated: >60 mL/min (ref >60)
Glucose, Bld: 99 mg/dL (ref 70–99)
Potassium: 2.9 mmol/L — ABNORMAL LOW (ref 3.5–5.1)
Sodium: 140 mmol/L (ref 135–145)

## 2020-01-30 MED ORDER — RIVAROXABAN (XARELTO) VTE STARTER PACK (15 & 20 MG): ORAL_TABLET | ORAL | 0 refills | Status: DC

## 2020-01-30 MED ORDER — POTASSIUM CHLORIDE CRYS ER 20 MEQ PO TBCR: 40.0000 meq | EXTENDED_RELEASE_TABLET | Freq: Once | ORAL | Status: DC

## 2020-01-30 NOTE — ED Provider Notes (Signed)
[facility] EMERGENCY DEPARTMENT Provider Note   CSN: [id number] Arrival date & time: 01/30/20  0554     History Chief Complaint  Patient presents with  . Leg Swelling    Kaitlyn Barnes is a 74 y.o. female.  HPI Patient is a 74 year old female with a past medical history of arthritis, reflux, HTN, hypothyroidism, seasonal allergies congenital short stature  She is presented today with left lower extremity pain that hurts from her pelvis all the way down to her ankle.  She states it began Thursday after she did her morning workouts.  She states that she has been working out heavily for the past several months and states that she has been losing weight as expected.  She states that she started having cramping pain in her leg and assumed that it was a muscle spasm from working out however she says it did not go away with time and seem to get worse and then she noticed some swelling over the past 48 hours.  She denies any chest pain, shortness of breath, fevers, night sweats, cough, hemoptysis, prior cancer diagnoses, hormone therapy, recent travel or immobilization, surgery.  She states she has never had a blood clot in the past.  She denies any unintentional weight loss.    Past Medical History:  Diagnosis Date  . Allergy   . Arthritis   . Complication of anesthesia   . Dysrhythmia    irregular  . GERD (gastroesophageal reflux disease)   . Hypertension   . Hypothyroidism   . PONV (postoperative nausea and vomiting)    nausea after C-Section  . Seasonal allergies     Patient Active Problem List   Diagnosis Date Noted  . Hypothyroidism 07/30/2010  . Hypertension 07/30/2010  . DJD (degenerative joint disease) 07/30/2010  . GERD (gastroesophageal reflux disease) 07/30/2010  . Dwarfism, achondroplastic 07/30/2010    Past Surgical History:  Procedure Laterality Date  . ABDOMINAL HYSTERECTOMY    . BACK SURGERY  10/21/2012   Dr.Betero  . CESAREAN  SECTION    . DENTAL SURGERY     removed     OB History   No obstetric history on file.     Family History  Problem Relation Age of Onset  . Colon cancer Neg Hx     Social History   Tobacco Use  . Smoking status: Current Every Day Smoker    Packs/day: 0.30    Years: 20.00    Pack years: 6.00    Types: Cigarettes  . Smokeless tobacco: Never Used  Substance Use Topics  . Alcohol use: Yes    Alcohol/week: 3.0 standard drinks    Types: 3 Glasses of wine per week  . Drug use: No    Home Medications Prior to Admission medications   Medication Sig Start Date End Date Taking? Authorizing Provider  amLODipine-benazepril (LOTREL) 10-20 MG per capsule Take 1 capsule by mouth daily.    [provider]  atorvastatin (LIPITOR) 20 MG tablet Take 20 mg by mouth daily.    [provider]  cetirizine (ZYRTEC) 10 MG tablet Take 10 mg by mouth daily.    [provider]  GuaiFENesin (MUCINEX PO) Take 1 tablet by mouth daily.    [provider]  levothyroxine (SYNTHROID, LEVOTHROID) 100 MCG tablet Take 100 mcg by mouth daily.   07/29/08   [provider]  omeprazole (PRILOSEC) 20 MG capsule Take 20 mg by mouth daily.   07/29/08   [provider]  potassium gluconate 595 MG TABS tablet Take 595 mg by mouth daily.    [provider]  RIVAROXABAN Alveda Reasons) VTE STARTER PACK (15 & 20 MG TABLETS) Follow package directions: Take one 15mg  tablet by mouth twice a day. On day 22, switch to one 20mg  tablet once a day. Take with food. 01/30/20   Tedd Sias, PA    Allergies    Other  Review of Systems   Review of Systems  Constitutional: Negative for fever.  HENT: Negative for congestion.   Respiratory: Negative for shortness of breath.   Cardiovascular: Negative for chest pain.  Gastrointestinal: Negative for abdominal distention.  Musculoskeletal:       Left leg pain  Neurological: Negative for dizziness and headaches.     Physical Exam Updated Vital Signs BP (!) 157/89 (BP Location: Left Arm)   Pulse 84   Temp 98.1 F (36.7 C) (Oral)   Resp 18   SpO2 92%   Physical Exam Vitals and nursing note reviewed.  Constitutional:      General: She is not in acute distress.    Comments: Pleasant well-appearing 74 year old female in no acute distress sitting comfortably in bed.  No increased work of breathing.  Speaking full sentences.  HENT:     Head: Normocephalic and atraumatic.     Nose: Nose normal.  Eyes:     General: No scleral icterus. Cardiovascular:     Rate and Rhythm: Normal rate and regular rhythm.     Pulses: Normal pulses.     Heart sounds: Normal heart sounds.     Comments: DP PT pulses easily palpable. Pulmonary:     Effort: Pulmonary effort is normal. No respiratory distress.     Breath sounds: Normal breath sounds. No wheezing.  Abdominal:     Palpations: Abdomen is soft.     Tenderness: There is no abdominal tenderness. There is no guarding or rebound.  Musculoskeletal:     Cervical back: Normal range of motion.     Comments: No significant edema in either leg there is notable left leg swelling however this appears to extend from the thigh to the calf.  Skin:    General: Skin is warm and dry.     Capillary Refill: Capillary refill takes less than 2 seconds.  Neurological:     Mental Status: She is alert. Mental status is at baseline.     Comments: Sensation intact in bilateral lower extremities.  Psychiatric:        Mood and Affect: Mood normal.        Behavior: Behavior normal.     ED Results / Procedures / Treatments   Labs (all labs ordered are listed, but only abnormal results are displayed) Labs Reviewed  CBC WITH DIFFERENTIAL/PLATELET - Abnormal; Notable for the following components:      Result Value   MCV 109.9 (*)    MCH 37.2 (*)    Platelets 56 (*)    All other components within normal limits  BASIC METABOLIC PANEL - Abnormal; Notable for the following  components:   Potassium 2.9 (*)    CO2 21 (*)    All other components within normal limits    EKG None  Radiology VAS [place] LOWER EXTREMITY VENOUS (DVT) ([facility] and [facility] 7a-7p)  Result Date: 01/30/2020  Lower Venous DVT Study Indications: Edema, and posterior calf pain.  Limitations: Body habitus and bowel gas, edema. Comparison Study: No prior study Performing Technologist: Sharion Dove RVS  Examination  Guidelines: A complete evaluation includes B-mode imaging, spectral Doppler, color Doppler, and power Doppler as needed of all accessible portions of each vessel. Bilateral testing is considered an integral part of a complete examination. Limited examinations for reoccurring indications may be performed as noted. The reflux portion of the exam is performed with the patient in reverse Trendelenburg.  +-----+---------------+---------+-----------+----------+--------------+ RIGHTCompressibilityPhasicitySpontaneityPropertiesThrombus Aging +-----+---------------+---------+-----------+----------+--------------+ CFV  Full           Yes      Yes                                 +-----+---------------+---------+-----------+----------+--------------+ EIV                                               patent         +-----+---------------+---------+-----------+----------+--------------+   +---------+---------------+---------+-----------+----------+-------------------+ LEFT     CompressibilityPhasicitySpontaneityPropertiesThrombus Aging      +---------+---------------+---------+-----------+----------+-------------------+ CFV      Partial                                      Acute               +---------+---------------+---------+-----------+----------+-------------------+ SFJ      Partial                                      Acute               +---------+---------------+---------+-----------+----------+-------------------+ FV Prox  None                                          Acute               +---------+---------------+---------+-----------+----------+-------------------+ FV Mid   None                                         Acute               +---------+---------------+---------+-----------+----------+-------------------+ FV DistalNone                                         Acute               +---------+---------------+---------+-----------+----------+-------------------+ PFV                                                   Not visualized      +---------+---------------+---------+-----------+----------+-------------------+ POP      None                                         Acute               +---------+---------------+---------+-----------+----------+-------------------+  PTV      None                                         Acute               +---------+---------------+---------+-----------+----------+-------------------+ PERO                                                  Not visualized      +---------+---------------+---------+-----------+----------+-------------------+ Soleal   None                                         Acute               +---------+---------------+---------+-----------+----------+-------------------+ Gastroc  None                                         Acute               +---------+---------------+---------+-----------+----------+-------------------+ EIV                                                   Acute               +---------+---------------+---------+-----------+----------+-------------------+ CIV                                                   acute in distal and                                                       mid, unable to                                                            visualize distal                                                          CIV or IVC           +---------+---------------+---------+-----------+----------+-------------------+     Summary: RIGHT: - No evidence of common femoral or external iliac vein obstruction.  LEFT: - Findings consistent with acute deep vein thrombosis involving the left common femoral vein, SF junction, left femoral vein, left popliteal vein, left posterior tibial veins, left peroneal veins, left  soleal veins, left gastrocnemius veins, and EIV, mid  to distal CIV.  *See table(s) above for measurements and observations. Electronically signed by Monica Martinez MD on 01/30/2020 at 10:53:33 AM.    Final     Procedures Procedures (including critical care time)  Medications Ordered in ED Medications  potassium chloride SA (KLOR-CON) CR tablet 40 mEq (40 mEq Oral Not Given 01/30/20 1206)    ED Course  I have reviewed the triage vital signs and the nursing notes.  Pertinent labs & imaging results that were available during my care of the patient were reviewed by me and considered in my medical decision making (see chart for details).  74 year old female with past medical history detailed above presented a for lower extremity pain and swelling.  Physical exam is notable for left lower extremity swelling.  Good pedal pulses sensation and movement.  Physical exam otherwise reassuring.  BMP with mild hypokalemia will give oral repletion and have her follow-up with PCP for recheck.  CBC without leukocytosis or anemia.  She does have thrombocytopenia which appears that it was present 7 years ago however no more recent comparison labs.  Will discuss thrombocytopenia with pharmacy prior to placing on anticoagulation.  DVT study shows significant thrombosis from the left external iliac progressing distally.   Clinical Course as of Jan 30 1628  Sun Jan 30, 2020  1031 Discussed with Dr. Carlis Abbott of vascular surgery.  He will see patient in consultation.  Plan at this time is to discharge with anticoagulation pending evaluation by Dr.  Carlis Abbott.   [WF]    Clinical Course User Index [WF] Tedd Sias, PA  I discussed this case with my attending physician who cosigned this note including patient's presenting symptoms, physical exam, and planned diagnostics and interventions. Attending physician stated agreement with plan or made changes to plan which were implemented.  Attending physician assessed patient at bedside. Patient has acute DVT of the left lower extremity.  Due to the extensive nature Dr. Carlis Abbott of vascular surgery evaluated patient at bedside.  Will discharge with close follow-up with his office.  Will place on anticoagulant either Eliquis or Xarelto will discuss with pharmacy to determine which.  Patient understanding of anticoagulant therapy needed and of plan.  She is agreeable to this.  Feels comfortable with discharge and understand strict return precautions for any chest pain, shortness of breath, heart rotations, other concerning symptoms.   MDM Rules/Calculators/A&P                          Patient discharged on rivaroxaban.  Pharmacy provided patient with education on this.  I recommended that she discontinue the meloxicam that she is prescribed.  She understands the need to refrain from ibuprofen and Aleve and other NSAID use.  Final Clinical Impression(s) / ED Diagnoses Final diagnoses:  Acute deep vein thrombosis (DVT) of iliac vein of left lower extremity ([place])    Rx / DC Orders ED Discharge Orders         Ordered    RIVAROXABAN (XARELTO) VTE STARTER PACK (15 & 20 MG TABLETS)        01/30/20 1131           Pati Gallo [place], [place] 01/30/20 1630    Blanchie Dessert, MD 02/03/20 1529

## 2020-01-30 NOTE — Discharge Instructions (Signed)
You have a blood clot in your leg.  Will follow up with Dr. Carlis Abbott next Tuesday.  Please take your medications as prescribed. Avoid all NSAIDs such as meloxicam and ibuprofen/aleve   You may use tylenol 500-1000mg  every 6 hours for pain    Information on my medicine - XARELTO (rivaroxaban)  WHY WAS XARELTO PRESCRIBED FOR YOU? Xarelto was prescribed to treat blood clots that may have been found in the veins of your legs (deep vein thrombosis) or in your lungs (pulmonary embolism) and to reduce the risk of them occurring again.  What do you need to know about Xarelto? The starting dose is one 15 mg tablet taken TWICE daily with food for the FIRST 21 DAYS then on 02/20/20   the dose is changed to one 20 mg tablet taken ONCE A DAY with your evening meal.  DO NOT stop taking Xarelto without talking to the health care provider who prescribed the medication.  Refill your prescription for 20 mg tablets before you run out.  After discharge, you should have regular check-up appointments with your healthcare provider that is prescribing your Xarelto.  In the future your dose may need to be changed if your kidney function changes by a significant amount.  What do you do if you miss a dose? If you are taking Xarelto TWICE DAILY and you miss a dose, take it as soon as you remember. You may take two 15 mg tablets (total 30 mg) at the same time then resume your regularly scheduled 15 mg twice daily the next day.  If you are taking Xarelto ONCE DAILY and you miss a dose, take it as soon as you remember on the same day then continue your regularly scheduled once daily regimen the next day. Do not take two doses of Xarelto at the same time.   Important Safety Information Xarelto is a blood thinner medicine that can cause bleeding. You should call your healthcare provider right away if you experience any of the following: ? Bleeding from an injury or your nose that does not stop. ? Unusual colored  urine (red or dark brown) or unusual colored stools (red or black). ? Unusual bruising for unknown reasons. ? A serious fall or if you hit your head (even if there is no bleeding).  Some medicines may interact with Xarelto and might increase your risk of bleeding while on Xarelto. To help avoid this, consult your healthcare provider or pharmacist prior to using any new prescription or non-prescription medications, including herbals, vitamins, non-steroidal anti-inflammatory drugs (NSAIDs) and supplements.  This website has more information on Xarelto: [email or web address]

## 2020-01-30 NOTE — ED Triage Notes (Signed)
Pt reports L leg swelling and pain since Thursday morning after exercising more than usual on Wednesday.

## 2020-01-30 NOTE — Progress Notes (Signed)
I was called to evaluate patient for extensive left leg DVT.  When I came to the ED, she was already dressed, does not want to get back on the strentcher for exam, and states she is leaving.  Briefly discussed role for anticoagulation for DVT and ultimately possible percutaneous venous thrombectomy to prevent post-thrombotic syndrome.  She wants to see me on outpatient basis and is ready for discharge.  ED plans discharge on [place].  Will arrange follow-up with me in the office next week.  I gave her our contact information.  Marty Heck, MD [facility] Office: [place]

## 2020-01-30 NOTE — Progress Notes (Signed)
VASCULAR LAB    Left lower extremity venous duplex has been performed.  See CV proc for preliminary results.   Gave verbal report to [employer], PA-C  Melvina Pangelinan, RVT 01/30/2020, 9:55 AM

## 2020-01-30 NOTE — ED Notes (Signed)
Patient transported to Ultrasound 

## 2020-01-30 NOTE — ED Notes (Signed)
Patient verbalizes understanding of discharge instructions. Opportunity for questioning and answers were provided. Armband removed by staff, pt discharged from ED.  

## 2020-02-08 ENCOUNTER — Ambulatory Visit: Payer: Medicare HMO | Admitting: Vascular Surgery

## 2020-02-08 ENCOUNTER — Encounter: Payer: Self-pay | Admitting: Vascular Surgery

## 2020-02-08 ENCOUNTER — Other Ambulatory Visit: Payer: Self-pay

## 2020-02-08 DIAGNOSIS — I82422 Acute embolism and thrombosis of left iliac vein: Secondary | ICD-10-CM

## 2020-02-08 DIAGNOSIS — I82409 Acute embolism and thrombosis of unspecified deep veins of unspecified lower extremity: Secondary | ICD-10-CM | POA: Insufficient documentation

## 2020-02-08 DIAGNOSIS — M7989 Other specified soft tissue disorders: Secondary | ICD-10-CM

## 2020-02-08 NOTE — Progress Notes (Signed)
Patient name: Kaitlyn Barnes MRN: [id number] DOB: Nov 05, 1945 Sex: female  REASON FOR CONSULT: Evaluate extensive left leg DVT   HPI: Kaitlyn Barnes is a 74 y.o. female, with history of hypertension that presents for evaluation of extensive left leg DVT.  She was initially seen in the ED approximately 1.5 weeks ago with acute left leg swelling.  Ultimately venous duplex showed extensive left leg DVT with involvement of the left iliac vein.  I was consulted to see her at the time but she ultimately was leaving at the time of my evaluation and did not want to stay for further evaluation.  On follow-up today she has been on Xarelto and states her leg is about 25% better.  The pain has been improving.  Denies any trauma.  No history of thromboembolic events or other DVTs in the past.  She is 4 foot 2 inches tall and denies any known history of dwarfism.    Past Medical History:  Diagnosis Date  . Allergy   . Arthritis   . Complication of anesthesia   . Dysrhythmia    irregular  . GERD (gastroesophageal reflux disease)   . Hypertension   . Hypothyroidism   . PONV (postoperative nausea and vomiting)    nausea after C-Section  . Seasonal allergies     Past Surgical History:  Procedure Laterality Date  . ABDOMINAL HYSTERECTOMY    . BACK SURGERY  10/21/2012   Dr.Betero  . CESAREAN SECTION    . DENTAL SURGERY     removed    Family History  Problem Relation Age of Onset  . Colon cancer Neg Hx     SOCIAL HISTORY: Social History   Socioeconomic History  . Marital status: Single    Spouse name: Not on file  . Number of children: Not on file  . Years of education: Not on file  . Highest education level: Not on file  Occupational History  . Not on file  Tobacco Use  . Smoking status: Current Every Day Smoker    Packs/day: 0.30    Years: 20.00    Pack years: 6.00    Types: Cigarettes  . Smokeless tobacco: Never Used  Substance and Sexual Activity  . Alcohol  use: Yes    Alcohol/week: 3.0 standard drinks    Types: 3 Glasses of wine per week  . Drug use: No  . Sexual activity: Not on file  Other Topics Concern  . Not on file  Social History Narrative  . Not on file   Social Determinants of Health   Financial Resource Strain:   . Difficulty of Paying Living Expenses: Not on file  Food Insecurity:   . Worried About Charity fundraiser in the Last Year: Not on file  . Ran Out of Food in the Last Year: Not on file  Transportation Needs:   . Lack of Transportation (Medical): Not on file  . Lack of Transportation (Non-Medical): Not on file  Physical Activity:   . Days of Exercise per Week: Not on file  . Minutes of Exercise per Session: Not on file  Stress:   . Feeling of Stress : Not on file  Social Connections:   . Frequency of Communication with Friends and Family: Not on file  . Frequency of Social Gatherings with Friends and Family: Not on file  . Attends Religious Services: Not on file  . Active Member of Clubs or Organizations: Not on file  . Attends Club  or Organization Meetings: Not on file  . Marital Status: Not on file  Intimate Partner Violence:   . Fear of Current or Ex-Partner: Not on file  . Emotionally Abused: Not on file  . Physically Abused: Not on file  . Sexually Abused: Not on file    Allergies  Allergen Reactions  . Other Hives and Cough    Dogs , Cats, Rag Weed  . Dog Epithelium Allergy Skin Test   . Sulfa Antibiotics     Current Outpatient Medications  Medication Sig Dispense Refill  . amLODipine-benazepril (LOTREL) 10-20 MG per capsule Take 1 capsule by mouth daily.    Marland Kitchen atorvastatin (LIPITOR) 20 MG tablet Take 20 mg by mouth daily.    . cetirizine (ZYRTEC) 10 MG tablet Take 10 mg by mouth daily.    . GuaiFENesin (MUCINEX PO) Take 1 tablet by mouth daily.    Marland Kitchen levothyroxine (SYNTHROID, LEVOTHROID) 100 MCG tablet Take 100 mcg by mouth daily.      Marland Kitchen omeprazole (PRILOSEC) 20 MG capsule Take 20 mg by  mouth daily.      . potassium gluconate 595 MG TABS tablet Take 595 mg by mouth daily.    Marland Kitchen RIVAROXABAN (XARELTO) VTE STARTER PACK (15 & 20 MG TABLETS) Follow package directions: Take one 15mg  tablet by mouth twice a day. On day 22, switch to one 20mg  tablet once a day. Take with food. 51 each 0   No current facility-administered medications for this visit.    REVIEW OF SYSTEMS:  [X]  denotes positive finding, [ ]  denotes negative finding Cardiac  Comments:  Chest pain or chest pressure:    Shortness of breath upon exertion:    Short of breath when lying flat:    Irregular heart rhythm:        Vascular    Pain in calf, thigh, or hip brought on by ambulation:    Pain in feet at night that wakes you up from your sleep:     Blood clot in your veins:    Leg swelling:  x Left       Pulmonary    Oxygen at home:    Productive cough:     Wheezing:         Neurologic    Sudden weakness in arms or legs:     Sudden numbness in arms or legs:     Sudden onset of difficulty speaking or slurred speech:    Temporary loss of vision in one eye:     Problems with dizziness:         Gastrointestinal    Blood in stool:     Vomited blood:         Genitourinary    Burning when urinating:     Blood in urine:        Psychiatric    Major depression:         Hematologic    Bleeding problems:    Problems with blood clotting too easily:        Skin    Rashes or ulcers:        Constitutional    Fever or chills:      PHYSICAL EXAM: Vitals:   02/08/20 1349  BP: (!) 158/73  Pulse: 66  Resp: 16  Temp: (!) 97.5 F (36.4 C)  TempSrc: Temporal  SpO2: 97%  Weight: 130 lb (59 kg)  Height: 4\' 2"  (1.27 m)    GENERAL: The patient is a well-nourished female,  in no acute distress. The vital signs are documented above. CARDIAC: There is a regular rate and rhythm.  VASCULAR:  Left leg larger than right as pictured below, no phlegmasia. Palpable DP pulses bilaterally. PULMONARY: No  respiratory distress. ABDOMEN: Soft and non-tender with normal pitched bowel sounds.  MUSCULOSKELETAL: There are no major deformities or cyanosis. NEUROLOGIC: No focal weakness or paresthesias are detected. SKIN: There are no ulcers or rashes noted. PSYCHIATRIC: The patient has a normal affect.      DATA:   Venous duplex from 01/30/2020 shows acute left leg DVT with involvement of the left common iliac and external iliac vein.  Assessment/Plan:  74 year old female presents with extensive left leg DVT.  Discussed that in a female with extensive left leg DVT certainly raises the question of May Thurner syndrome given no previous history of thromboembolic events.  Unfortunately Ms. Warshaw is only 4 feet tall and I think our options for traditional percutaneous intervention are somewhat limited.  I have been using the Innari ClotTriever for percutaneous mechanical thrombectomy and this device would be too long for her body habitus which I discussed with her in detail.  I think the only other option would be thrombolysis with popliteal access and an overnight stay in the ICU.  Ultimately we discussed risk and benefits in detail including risk of head bleed, retroperitoneal bleed, access site complications etc.  Ultimately she wants to see how she does with continued improvement on the Xarelto and feels her leg is making improvement.  I fully support her decision and discussed goal of intervention is to prevent post-thrombotic syndrome (pain, swelling etc).  We got her sized for a knee-high compression sock today that will initially be a thigh-high stocking for her.  I will have her follow-up as needed.     Marty Heck, MD [facility] [place] Office: [phone]

## 2020-03-21 ENCOUNTER — Telehealth: Payer: Self-pay | Admitting: Hematology

## 2020-03-21 NOTE — Telephone Encounter (Signed)
Received a new hem referral from Dr. Maia Petties for Acute embolism and thrombosis of unspecified deep veins of unspecified lower extremity. Pt has been cld and scheduled to see Dr. Irene Limbo on 12/13 at 11am. Pt aware to arrive 30 minutes early.

## 2020-03-27 ENCOUNTER — Inpatient Hospital Stay: Payer: Medicare HMO | Attending: Hematology | Admitting: Hematology

## 2020-03-27 ENCOUNTER — Inpatient Hospital Stay: Payer: Medicare HMO

## 2020-03-27 ENCOUNTER — Other Ambulatory Visit: Payer: Self-pay

## 2020-03-27 VITALS — BP 139/64 | HR 66 | Temp 98.7°F | Resp 18 | Ht <= 58 in | Wt 132.4 lb

## 2020-03-27 DIAGNOSIS — I82432 Acute embolism and thrombosis of left popliteal vein: Secondary | ICD-10-CM | POA: Insufficient documentation

## 2020-03-27 DIAGNOSIS — I82442 Acute embolism and thrombosis of left tibial vein: Secondary | ICD-10-CM | POA: Insufficient documentation

## 2020-03-27 DIAGNOSIS — K219 Gastro-esophageal reflux disease without esophagitis: Secondary | ICD-10-CM | POA: Insufficient documentation

## 2020-03-27 DIAGNOSIS — I82402 Acute embolism and thrombosis of unspecified deep veins of left lower extremity: Secondary | ICD-10-CM | POA: Diagnosis not present

## 2020-03-27 DIAGNOSIS — E876 Hypokalemia: Secondary | ICD-10-CM | POA: Diagnosis not present

## 2020-03-27 DIAGNOSIS — I82462 Acute embolism and thrombosis of left calf muscular vein: Secondary | ICD-10-CM | POA: Insufficient documentation

## 2020-03-27 DIAGNOSIS — Z6837 Body mass index (BMI) 37.0-37.9, adult: Secondary | ICD-10-CM | POA: Insufficient documentation

## 2020-03-27 DIAGNOSIS — D696 Thrombocytopenia, unspecified: Secondary | ICD-10-CM | POA: Insufficient documentation

## 2020-03-27 DIAGNOSIS — Q774 Achondroplasia: Secondary | ICD-10-CM | POA: Insufficient documentation

## 2020-03-27 DIAGNOSIS — F17210 Nicotine dependence, cigarettes, uncomplicated: Secondary | ICD-10-CM | POA: Diagnosis not present

## 2020-03-27 DIAGNOSIS — I10 Essential (primary) hypertension: Secondary | ICD-10-CM | POA: Diagnosis not present

## 2020-03-27 DIAGNOSIS — E039 Hypothyroidism, unspecified: Secondary | ICD-10-CM | POA: Diagnosis not present

## 2020-03-27 DIAGNOSIS — Z7901 Long term (current) use of anticoagulants: Secondary | ICD-10-CM | POA: Insufficient documentation

## 2020-03-27 DIAGNOSIS — I82412 Acute embolism and thrombosis of left femoral vein: Secondary | ICD-10-CM | POA: Diagnosis present

## 2020-03-27 DIAGNOSIS — I82452 Acute embolism and thrombosis of left peroneal vein: Secondary | ICD-10-CM | POA: Insufficient documentation

## 2020-03-27 DIAGNOSIS — I82422 Acute embolism and thrombosis of left iliac vein: Secondary | ICD-10-CM

## 2020-03-27 DIAGNOSIS — M7989 Other specified soft tissue disorders: Secondary | ICD-10-CM | POA: Diagnosis not present

## 2020-03-27 DIAGNOSIS — Z79899 Other long term (current) drug therapy: Secondary | ICD-10-CM | POA: Diagnosis not present

## 2020-03-27 LAB — ANTITHROMBIN III: AntiThromb III Func: 90 % (ref 75–120)

## 2020-03-27 LAB — CMP (CANCER CENTER ONLY)
ALT: 9 U/L (ref 0–44)
AST: 17 U/L (ref 15–41)
Albumin: 3.8 g/dL (ref 3.5–5.0)
Alkaline Phosphatase: 73 U/L (ref 38–126)
Anion gap: 8 (ref 5–15)
BUN: 11 mg/dL (ref 8–23)
CO2: 28 mmol/L (ref 22–32)
Calcium: 9.4 mg/dL (ref 8.9–10.3)
Chloride: 106 mmol/L (ref 98–111)
Creatinine: 0.89 mg/dL (ref 0.44–1.00)
GFR, Estimated: >60 mL/min (ref >60)
Glucose, Bld: 95 mg/dL (ref 70–99)
Potassium: 3.1 mmol/L — ABNORMAL LOW (ref 3.5–5.1)
Sodium: 142 mmol/L (ref 135–145)
Total Bilirubin: 0.7 mg/dL (ref 0.3–1.2)
Total Protein: 8.6 g/dL — ABNORMAL HIGH (ref 6.5–8.1)

## 2020-03-27 LAB — CBC WITH DIFFERENTIAL/PLATELET
Abs Immature Granulocytes: 0 10*3/uL (ref 0.00–0.07)
Basophils Absolute: 0 10*3/uL (ref 0.0–0.1)
Basophils Relative: 1 %
Eosinophils Absolute: 0.1 10*3/uL (ref 0.0–0.5)
Eosinophils Relative: 3 %
HCT: 48.4 % — ABNORMAL HIGH (ref 36.0–46.0)
Hemoglobin: 16.5 g/dL — ABNORMAL HIGH (ref 12.0–15.0)
Immature Granulocytes: 0 %
Lymphocytes Relative: 38 %
Lymphs Abs: 1.1 10*3/uL (ref 0.7–4.0)
MCH: 36.8 pg — ABNORMAL HIGH (ref 26.0–34.0)
MCHC: 34.1 g/dL (ref 30.0–36.0)
MCV: 108 fL — ABNORMAL HIGH (ref 80.0–100.0)
Monocytes Absolute: 0.3 10*3/uL (ref 0.1–1.0)
Monocytes Relative: 11 %
Neutro Abs: 1.4 10*3/uL — ABNORMAL LOW (ref 1.7–7.7)
Neutrophils Relative %: 47 %
Platelets: 169 10*3/uL (ref 150–400)
RBC: 4.48 MIL/uL (ref 3.87–5.11)
RDW: 16.4 % — ABNORMAL HIGH (ref 11.5–15.5)
WBC: 3 10*3/uL — ABNORMAL LOW (ref 4.0–10.5)
nRBC: 0 % (ref 0.0–0.2)

## 2020-03-27 LAB — VITAMIN B12: Vitamin B-12: 162 pg/mL — ABNORMAL LOW (ref 180–914)

## 2020-03-27 LAB — IMMATURE PLATELET FRACTION: Immature Platelet Fraction: 3.1 % (ref 1.2–8.6)

## 2020-03-27 NOTE — Patient Instructions (Signed)
Thank you for choosing [facility] to provide your oncology and hematology care.   Should you have questions after your visit to the [facility] ([facility]), please contact this office at [phone] between 8:30 AM and 4:30 PM.  Voice mails left after 4:00 PM may not be returned until the following business day.  Calls received after 4:30 PM will be answered by an off-site Nurse Triage Line.    Prescription Refills:  Please have your pharmacy contact us directly for most prescription requests.  Contact the office directly for refills of narcotics (pain medications). Allow 48-72 hours for refills.  Appointments: Please contact the [facility] scheduling department [phone] for questions regarding [facility] appointment scheduling.  Contact the schedulers with any scheduling changes so that your appointment can be rescheduled in a timely manner.   Central Scheduling for [place] [phone] - Call to schedule procedures such as PET scans, CT scans, MRI, Ultrasound, etc.  To afford each patient quality time with our providers, please arrive 30 minutes before your scheduled appointment time.  If you arrive late for your appointment, you may be asked to reschedule.  We strive to give you quality time with our providers, and arriving late affects you and other patients whose appointments are after yours. If you are a no show for multiple scheduled visits, you may be dismissed from the clinic at the providers discretion.     Resources: [facility] Social Workers [phone] for additional information on assistance programs or assistance connecting with community support programs   [place] DSS  [phone]: Information regarding food stamps, Medicaid, and utility assistance [place] [employer] [place] [phone]   [place] [employer]'s shared-ride transportation service for eligible riders who have a disability that prevents them from riding the fixed route bus.   Medicare  Rights Center 800-333-4114 Helps people with Medicare understand their rights and benefits, navigate the Medicare system, and secure the quality healthcare they deserve [employer] 800-227-2345 Assists patients locate various types of support and financial assistance Cancer Care: 1-800-813-HOPE (4673) Provides financial assistance, online support groups, medication/co-pay assistance.   Transportation Assistance for appointments at [facility]: Transportation Coordinator [phone]  Again, thank you for choosing [facility] for your care.       

## 2020-03-27 NOTE — Progress Notes (Signed)
HEMATOLOGY/ONCOLOGY CONSULTATION NOTE  Date of Service: 03/27/2020  Patient Care Team: Audley Hose, MD as PCP - General (Internal Medicine)  CHIEF COMPLAINTS/PURPOSE OF CONSULTATION:  DVT  HISTORY OF PRESENTING ILLNESS:    Kaitlyn Barnes is a wonderful 74 y.o. female who has been referred to [place] by Dr. Maia Petties for evaluation and management of DVT. The pt reports that she is doing well overall.   The pt reports that she had no blood clots prior to her event in October. She has no family history of blood clots, blood disorders, or cancers. Pt began to exercise and lost over 15 lbs over the last year. During one of her exercise sessions the pt felt a sharp pain in her left thigh. The discomfort was so bothersome that she struggled to walk. After this occurred she stretched, took two Tylenol, and laid down. When the patient awoke she noticed that her entire left leg, from her groid to her ankle, was swollen. Two days later she went to the ED where they completed an ultrasound of her left leg. Pt denies any vaccinations, injury to the area, long-distance travel, or surgery directly before her clot. Pt was started on Actonel in the months before her clot, but asked to stop due to the medication causing fevers, chills, nausea, and vomiting. She held the medication about three weeks prior to the clotting event. Pt was smoking at the time of her blood clot.  She still has some left leg swelling and has been using compression socks. She feels that her leg swelling is better by at least 50%. Pt has also continued smoking.   Pt has a history of achondroplasia and hypothyroidism. Pt is unsure why she has chronic hypokalemia or thrombocytopenia. She has continued Folic acid and Vitamin B12 replacement since her hospital visit. Pt is up to date with age appropriate cancer screenings. Pt has been struggling emotionally with the passing of her mother last year, as they were really close.  Of  note prior to the patient's visit today, pt has had [place] Lower Extremity Venous Left completed on 01/30/2020 with results revealing "RIGHT: - No evidence of common femoral or external iliac vein obstruction. LEFT: - Findings consistent with acute deep vein thrombosis involving the left common femoral vein, SF junction, left femoral vein, left popliteal vein, left posterior tibial veins, left peroneal veins, left soleal veins, left gastrocnemius veins, and EIV, mid to distal CIV."  Most recent lab results (02/17/2020) of CBC w/diff & CMP is as follows: all values are WNL except for WBC at 3.4K, MCV at 106.1, MCH at 36.6, Potassium at 3.2, Globulin at 3.8. 02/17/2020 Homocysteine at 33.7 02/17/2020 Folate at 2.8  On review of systems, pt reports left left swelling and denies unexpected weight loss, abnormal vaginal discharge, abdominal pain, urinary habit changes, bowel habit changes, fevers, chills, night sweats, chest pain SOB and any other symptoms.   On PMHx the pt reports Achondroplasia, Hypothyroidism, HTN. On Social Hx the pt reports that she smokes 3-5 cigarettes per day. She denies any heavier smoking history previously.  MEDICAL HISTORY:  Past Medical History:  Diagnosis Date  . Allergy   . Arthritis   . Complication of anesthesia   . Dysrhythmia    irregular  . GERD (gastroesophageal reflux disease)   . Hypertension   . Hypothyroidism   . PONV (postoperative nausea and vomiting)    nausea after C-Section  . Seasonal allergies     SURGICAL HISTORY: Past  Surgical History:  Procedure Laterality Date  . ABDOMINAL HYSTERECTOMY    . BACK SURGERY  10/21/2012   Dr.Betero  . CESAREAN SECTION    . DENTAL SURGERY     removed    SOCIAL HISTORY: Social History   Socioeconomic History  . Marital status: Single    Spouse name: Not on file  . Number of children: Not on file  . Years of education: Not on file  . Highest education level: Not on file  Occupational History  . Not on  file  Tobacco Use  . Smoking status: Current Every Day Smoker    Packs/day: 0.30    Years: 20.00    Pack years: 6.00    Types: Cigarettes  . Smokeless tobacco: Never Used  Substance and Sexual Activity  . Alcohol use: Yes    Alcohol/week: 3.0 standard drinks    Types: 3 Glasses of wine per week  . Drug use: No  . Sexual activity: Not on file  Other Topics Concern  . Not on file  Social History Narrative  . Not on file   Social Determinants of Health   Financial Resource Strain: Not on file  Food Insecurity: Not on file  Transportation Needs: Not on file  Physical Activity: Not on file  Stress: Not on file  Social Connections: Not on file  Intimate Partner Violence: Not on file    FAMILY HISTORY: Family History  Problem Relation Age of Onset  . Colon cancer Neg Hx     ALLERGIES:  is allergic to other, dog epithelium allergy skin test, and sulfa antibiotics.  MEDICATIONS:  Current Outpatient Medications  Medication Sig Dispense Refill  . amLODipine-benazepril (LOTREL) 10-20 MG per capsule Take 1 capsule by mouth daily.    Marland Kitchen atorvastatin (LIPITOR) 20 MG tablet Take 20 mg by mouth daily.    . cetirizine (ZYRTEC) 10 MG tablet Take 10 mg by mouth daily.    . GuaiFENesin (MUCINEX PO) Take 1 tablet by mouth daily.    Marland Kitchen levothyroxine (SYNTHROID, LEVOTHROID) 100 MCG tablet Take 100 mcg by mouth daily.      Marland Kitchen omeprazole (PRILOSEC) 20 MG capsule Take 20 mg by mouth daily.      . potassium gluconate 595 MG TABS tablet Take 595 mg by mouth daily.    Marland Kitchen RIVAROXABAN (XARELTO) VTE STARTER PACK (15 & 20 MG TABLETS) Follow package directions: Take one 15mg  tablet by mouth twice a day. On day 22, switch to one 20mg  tablet once a day. Take with food. 51 each 0   No current facility-administered medications for this visit.    REVIEW OF SYSTEMS:    10 Point review of Systems was done is negative except as noted above.  PHYSICAL EXAMINATION: ECOG PERFORMANCE STATUS: 1 - Symptomatic  but completely ambulatory  . Vitals:   03/27/20 1133  BP: 139/64  Pulse: 66  Resp: 18  Temp: 98.7 F (37.1 C)  SpO2: 100%   Filed Weights   03/27/20 1133  Weight: 132 lb 6.4 oz (60.1 kg)   .Body mass index is 37.23 kg/m.  Exam was given in a chair   GENERAL:alert, in no acute distress and comfortable SKIN: no acute rashes, no significant lesions EYES: conjunctiva are pink and non-injected, sclera anicteric OROPHARYNX: MMM, no exudates, no oropharyngeal erythema or ulceration NECK: supple, no JVD LYMPH:  no palpable lymphadenopathy in the cervical, axillary or inguinal regions LUNGS: clear to auscultation b/l with normal respiratory effort HEART: regular rate & rhythm  ABDOMEN:  normoactive bowel sounds , non tender, not distended. Extremity: no pedal edema PSYCH: alert & oriented x 3 with fluent speech NEURO: no focal motor/sensory deficits  LABORATORY DATA:  I have reviewed the data as listed  . CBC Latest Ref Rng & Units 03/27/2020 01/30/2020 10/21/2012  WBC 4.0 - 10.5 K/uL 3.0(L) 4.7 7.8  Hemoglobin 12.0 - 15.0 g/dL 16.5(H) 15.0 12.2  Hematocrit 36.0 - 46.0 % 48.4(H) 44.3 36.8  Platelets 150 - 400 K/uL 169 56(L) 134(L)    . CMP Latest Ref Rng & Units 03/27/2020 01/30/2020 10/15/2012  Glucose 70 - 99 mg/dL 95 99 91  BUN 8 - 23 mg/dL 11 11 11   Creatinine 0.44 - 1.00 mg/dL 0.89 0.77 0.84  Sodium 135 - 145 mmol/L 142 140 142  Potassium 3.5 - 5.1 mmol/L 3.1(L) 2.9(L) 3.6  Chloride 98 - 111 mmol/L 106 108 107  CO2 22 - 32 mmol/L 28 21(L) 26  Calcium 8.9 - 10.3 mg/dL 9.4 9.0 9.3  Total Protein 6.5 - 8.1 g/dL 8.6(H) - -  Total Bilirubin 0.3 - 1.2 mg/dL 0.7 - -  Alkaline Phos 38 - 126 U/L 73 - -  AST 15 - 41 U/L 17 - -  ALT 0 - 44 U/L 9 - -     RADIOGRAPHIC STUDIES: I have personally reviewed the radiological images as listed and agreed with the findings in the report. No results found.  ASSESSMENT & PLAN:   74 yo female with achondroplasia with   1)  Extensive Left lower extremity DVT ? May thurner syndrome PLAN: -Discussed patient's most recent labs from 02/17/2020, all values are WNL except for WBC at 3.4K, MCV at 106.1, MCH at 36.6, Potassium at 3.2, Globulin at 3.8, Homocysteine at 33.7, Folate at 2.8.  -Advised pt that smoking was a factor in her blood clot, but may not have been the only cause.  -Recommend complete smoking cessation as it increases the risk of repeat clots. Pt is okay quitting without Nicotine replacement. -Advised pt that if there is no clear, provoking, reversible factor for her blood clot we would have to continue blood thinners life-long. -Advised pt that May-Thurner syndrome could have been a cause for her DVT. Will have Vascular Surgery evaluate.  -Will get labs today to r/o a blood clotting disorder  -Will refer to Vascular Surgery - Dr. Trula Slade -Will get CT Abd/Pel in 1 week -Will see back in 10-12 days    FOLLOW UP: Labs today Referral to Dr Trula Slade - Vascular surgery CT abd/pelvis in 1 week RTC with Dr Irene Limbo in 10-12 days  . Orders Placed This Encounter  Procedures  . CT Abdomen Pelvis W Contrast    Standing Status:   Future    Standing Expiration Date:   03/27/2021    Order Specific Question:   If indicated for the ordered procedure, I authorize the administration of contrast media per Radiology protocol    Answer:   Yes    Order Specific Question:   Preferred imaging location?    Answer:   [facility]    Order Specific Question:   Is Oral Contrast requested for this exam?    Answer:   Yes, Per Radiology protocol  . CBC with Differential/Platelet    Standing Status:   Future    Number of Occurrences:   1    Standing Expiration Date:   03/27/2021  . CMP ([facility] only)    Standing Status:   Future  Number of Occurrences:   1    Standing Expiration Date:   03/27/2021  . Immature Platelet Fraction    Standing Status:   Future    Number of Occurrences:   1    Standing  Expiration Date:   03/27/2021  . Factor 5 leiden    Standing Status:   Future    Number of Occurrences:   1    Standing Expiration Date:   03/27/2021  . Prothrombin gene mutation    Standing Status:   Future    Number of Occurrences:   1    Standing Expiration Date:   03/27/2021  . Antithrombin III    Standing Status:   Future    Number of Occurrences:   1    Standing Expiration Date:   03/27/2021  . Protein S, total and free    Standing Status:   Future    Number of Occurrences:   1    Standing Expiration Date:   03/27/2021  . Protein C activity    Standing Status:   Future    Number of Occurrences:   1    Standing Expiration Date:   03/27/2021  . Protein C, total    Standing Status:   Future    Number of Occurrences:   1    Standing Expiration Date:   03/27/2021  . Cardiolipin antibodies, IgG, IgM, IgA    Standing Status:   Future    Number of Occurrences:   1    Standing Expiration Date:   03/27/2021  . Beta-2-glycoprotein i abs, IgG/M/A    Standing Status:   Future    Number of Occurrences:   1    Standing Expiration Date:   03/27/2021  . Lupus anticoagulant panel    Standing Status:   Future    Number of Occurrences:   1    Standing Expiration Date:   03/27/2021  . Vitamin B12    Standing Status:   Future    Number of Occurrences:   1    Standing Expiration Date:   03/27/2021  . Folate RBC    Standing Status:   Future    Number of Occurrences:   1    Standing Expiration Date:   03/27/2021  . Ambulatory referral to Vascular Surgery    Referral Priority:   Urgent    Referral Type:   Surgical    Referral Reason:   Specialty Services Required    Referred to Provider:   Serafina Mitchell, MD    Requested Specialty:   Vascular Surgery    Number of Visits Requested:   1    All of the patients questions were answered with apparent satisfaction. The patient knows to call the clinic with any problems, questions or concerns.  I spent 40 mins counseling the patient  face to face. The total time spent in the appointment was 60 minutes and more than 50% was on counseling and direct patient cares.    Sullivan Lone MD [place] AAHIVMS [facility] Hematology/Oncology Physician [facility]  (Office):       [phone] (Work cell):  [phone] (Fax):           [phone]  03/27/2020 4:49 PM  I, Yevette Edwards, am acting as a scribe for Dr. Sullivan Lone.   .I have reviewed the above documentation for accuracy and completeness, and I agree with the above. Brunetta Genera MD

## 2020-03-28 LAB — BETA-2-GLYCOPROTEIN I ABS, IGG/M/A
Beta-2 Glyco I IgG: <9 GPI IgG units (ref 0–20)
Beta-2-Glycoprotein I IgA: <9 GPI IgA units (ref 0–25)
Beta-2-Glycoprotein I IgM: <9 GPI IgM units (ref 0–32)

## 2020-03-28 LAB — PROTEIN S, TOTAL AND FREE
Protein S Ag, Free: 102 % (ref 61–136)
Protein S Ag, Total: 96 % (ref 60–150)

## 2020-03-28 LAB — PROTEIN C, TOTAL: Protein C, Total: 81 % (ref 60–150)

## 2020-03-28 LAB — FOLATE RBC
Folate, Hemolysate: 269 ng/mL
Folate, RBC: 563 ng/mL (ref >498)
Hematocrit: 47.8 % — ABNORMAL HIGH (ref 34.0–46.6)

## 2020-03-28 LAB — PROTEIN C ACTIVITY: Protein C Activity: 99 % (ref 73–180)

## 2020-03-29 LAB — LUPUS ANTICOAGULANT PANEL
DRVVT: 76.9 s — ABNORMAL HIGH (ref 0.0–47.0)
PTT Lupus Anticoagulant: 42.6 s (ref 0.0–51.9)

## 2020-03-29 LAB — DRVVT CONFIRM: dRVVT Confirm: 1.4 ratio — ABNORMAL HIGH (ref 0.8–1.2)

## 2020-03-29 LAB — DRVVT MIX: dRVVT Mix: 56.1 s — ABNORMAL HIGH (ref 0.0–40.4)

## 2020-03-30 LAB — CARDIOLIPIN ANTIBODIES, IGG, IGM, IGA
Anticardiolipin IgA: <9 APL U/mL (ref 0–11)
Anticardiolipin IgG: <9 GPL U/mL (ref 0–14)
Anticardiolipin IgM: 23 MPL U/mL — ABNORMAL HIGH (ref 0–12)

## 2020-03-30 LAB — FACTOR 5 LEIDEN

## 2020-03-31 LAB — PROTHROMBIN GENE MUTATION

## 2020-04-11 ENCOUNTER — Telehealth: Payer: Self-pay | Admitting: *Deleted

## 2020-04-11 NOTE — Telephone Encounter (Signed)
Notified by D. Derry Lory. Resource Spec: CT Abd/Pelvis scheduled on 12/30 not approved by [employer]. Dr. Candise Che notified by staff message (MD on PAL).  Contacted patient to notify her that scan will be cancelled as insurance has not approved it at this time. She verbalized understanding.

## 2020-04-13 ENCOUNTER — Ambulatory Visit (HOSPITAL_COMMUNITY): Payer: Medicare HMO

## 2020-04-17 ENCOUNTER — Telehealth: Payer: Self-pay | Admitting: Hematology

## 2020-04-17 ENCOUNTER — Other Ambulatory Visit: Payer: Self-pay | Admitting: Hematology

## 2020-04-17 DIAGNOSIS — I82422 Acute embolism and thrombosis of left iliac vein: Secondary | ICD-10-CM

## 2020-04-17 NOTE — Telephone Encounter (Signed)
Scheduled per 12/13 los, patient has been called and notified. 

## 2020-04-18 ENCOUNTER — Telehealth: Payer: Self-pay | Admitting: *Deleted

## 2020-04-18 ENCOUNTER — Telehealth: Payer: Self-pay | Admitting: Hematology

## 2020-04-18 ENCOUNTER — Inpatient Hospital Stay: Payer: Medicare HMO | Admitting: Hematology

## 2020-04-18 NOTE — Telephone Encounter (Signed)
Per Dr. Candise Che - contacted patient to cancel today's appt and sent schedule message to r/s following CT scan on 1/6. Patient in agreement

## 2020-04-18 NOTE — Telephone Encounter (Signed)
Scheduled appt per 1/4 sch msg - pt is aware of appt date and time   

## 2020-04-18 NOTE — Progress Notes (Incomplete)
HEMATOLOGY/ONCOLOGY CONSULTATION NOTE  Date of Service: 04/18/2020  Patient Care Team: Harvest [place], MD as PCP - General (Internal Medicine)  CHIEF COMPLAINTS/PURPOSE OF CONSULTATION:  DVT  HISTORY OF PRESENTING ILLNESS:    Kaitlyn Barnes is a wonderful 75 y.o. female who has been referred to [place] by Dr. Corky Downs for evaluation and management of DVT. The pt reports that she is doing well overall.   The pt reports that she had no blood clots prior to her event in October. She has no family history of blood clots, blood disorders, or cancers. Pt began to exercise and lost over 15 lbs over the last year. During one of her exercise sessions the pt felt a sharp pain in her left thigh. The discomfort was so bothersome that she struggled to walk. After this occurred she stretched, took two Tylenol, and laid down. When the patient awoke she noticed that her entire left leg, from her groid to her ankle, was swollen. Two days later she went to the ED where they completed an ultrasound of her left leg. Pt denies any vaccinations, injury to the area, long-distance travel, or surgery directly before her clot. Pt was started on Actonel in the months before her clot, but asked to stop due to the medication causing fevers, chills, nausea, and vomiting. She held the medication about three weeks prior to the clotting event. Pt was smoking at the time of her blood clot.  She still has some left leg swelling and has been using compression socks. She feels that her leg swelling is better by at least 50%. Pt has also continued smoking.   Pt has a history of achondroplasia and hypothyroidism. Pt is unsure why she has chronic hypokalemia or thrombocytopenia. She has continued Folic acid and Vitamin B12 replacement since her hospital visit. Pt is up to date with age appropriate cancer screenings. Pt has been struggling emotionally with the passing of her mother last year, as they were really close.  Of note  prior to the patient's visit today, pt has had [place] Lower Extremity Venous Left completed on 01/30/2020 with results revealing "RIGHT: - No evidence of common femoral or external iliac vein obstruction. LEFT: - Findings consistent with acute deep vein thrombosis involving the left common femoral vein, SF junction, left femoral vein, left popliteal vein, left posterior tibial veins, left peroneal veins, left soleal veins, left gastrocnemius veins, and EIV, mid to distal CIV."  Most recent lab results (02/17/2020) of CBC w/diff & CMP is as follows: all values are WNL except for WBC at 3.4K, MCV at 106.1, MCH at 36.6, Potassium at 3.2, Globulin at 3.8. 02/17/2020 Homocysteine at 33.7 02/17/2020 Folate at 2.8  On review of systems, pt reports left left swelling and denies unexpected weight loss, abnormal vaginal discharge, abdominal pain, urinary habit changes, bowel habit changes, fevers, chills, night sweats, chest pain SOB and any other symptoms.   On PMHx the pt reports Achondroplasia, Hypothyroidism, HTN. On Social Hx the pt reports that she smokes 3-5 cigarettes per day. She denies any heavier smoking history previously.  INTERVAL HISTORY: Kaitlyn Barnes is a wonderful 75 y.o. female who is here for evaluation and management of DVT. The patient's last visit with [place] was on 03/27/2020. The pt reports that she is doing well overall.  The pt reports ***  Of note since the patient's last visit, pt has had *** completed on *** with results revealing ***.  Lab results (03/27/2020) of CBC w/diff  and CMP is as follows: all values are WNL except for ***. 03/27/2020 03/27/2020 03/27/2020 03/27/2020 03/27/2020 03/27/2020 03/27/2020 03/27/2020 03/27/2020 03/27/2020 03/27/2020 03/27/2020 03/27/2020  On review of systems, pt reports *** and denies ***and any other symptoms.   A&P: -Discussed pt labwork today, 04/18/20; *** -***  MEDICAL HISTORY:  Past Medical History:  Diagnosis  Date  . Allergy   . Arthritis   . Complication of anesthesia   . Dysrhythmia    irregular  . GERD (gastroesophageal reflux disease)   . Hypertension   . Hypothyroidism   . PONV (postoperative nausea and vomiting)    nausea after C-Section  . Seasonal allergies     SURGICAL HISTORY: Past Surgical History:  Procedure Laterality Date  . ABDOMINAL HYSTERECTOMY    . BACK SURGERY  10/21/2012   Dr.Betero  . CESAREAN SECTION    . DENTAL SURGERY     removed    SOCIAL HISTORY: Social History   Socioeconomic History  . Marital status: Single    Spouse name: Not on file  . Number of children: Not on file  . Years of education: Not on file  . Highest education level: Not on file  Occupational History  . Not on file  Tobacco Use  . Smoking status: Current Every Day Smoker    Packs/day: 0.30    Years: 20.00    Pack years: 6.00    Types: Cigarettes  . Smokeless tobacco: Never Used  Substance and Sexual Activity  . Alcohol use: Yes    Alcohol/week: 3.0 standard drinks    Types: 3 Glasses of wine per week  . Drug use: No  . Sexual activity: Not on file  Other Topics Concern  . Not on file  Social History Narrative  . Not on file   Social Determinants of Health   Financial Resource Strain: Not on file  Food Insecurity: Not on file  Transportation Needs: Not on file  Physical Activity: Not on file  Stress: Not on file  Social Connections: Not on file  Intimate Partner Violence: Not on file    FAMILY HISTORY: Family History  Problem Relation Age of Onset  . Colon cancer Neg Hx     ALLERGIES:  is allergic to other, dog epithelium allergy skin test, and sulfa antibiotics.  MEDICATIONS:  Current Outpatient Medications  Medication Sig Dispense Refill  . amLODipine-benazepril (LOTREL) 10-20 MG per capsule Take 1 capsule by mouth daily.    Marland Kitchen atorvastatin (LIPITOR) 20 MG tablet Take 20 mg by mouth daily.    . cetirizine (ZYRTEC) 10 MG tablet Take 10 mg by mouth daily.     . GuaiFENesin (MUCINEX PO) Take 1 tablet by mouth daily.    Marland Kitchen levothyroxine (SYNTHROID, LEVOTHROID) 100 MCG tablet Take 100 mcg by mouth daily.      Marland Kitchen omeprazole (PRILOSEC) 20 MG capsule Take 20 mg by mouth daily.      . potassium gluconate 595 MG TABS tablet Take 595 mg by mouth daily.    Marland Kitchen RIVAROXABAN (XARELTO) VTE STARTER PACK (15 & 20 MG TABLETS) Follow package directions: Take one 15mg  tablet by mouth twice a day. On day 22, switch to one 20mg  tablet once a day. Take with food. 51 each 0   No current facility-administered medications for this visit.    REVIEW OF SYSTEMS:   A 10+ POINT REVIEW OF SYSTEMS WAS OBTAINED including neurology, dermatology, psychiatry, cardiac, respiratory, lymph, extremities, GI, GU, Musculoskeletal, constitutional, breasts, reproductive, HEENT.  All pertinent  positives are noted in the HPI.  All others are negative.   PHYSICAL EXAMINATION: ECOG PERFORMANCE STATUS: 1 - Symptomatic but completely ambulatory  . There were no vitals filed for this visit. There were no vitals filed for this visit. .There is no height or weight on file to calculate BMI.  *** GENERAL:alert, in no acute distress and comfortable SKIN: no acute rashes, no significant lesions EYES: conjunctiva are pink and non-injected, sclera anicteric OROPHARYNX: MMM, no exudates, no oropharyngeal erythema or ulceration NECK: supple, no JVD LYMPH:  no palpable lymphadenopathy in the cervical, axillary or inguinal regions LUNGS: clear to auscultation b/l with normal respiratory effort HEART: regular rate & rhythm ABDOMEN:  normoactive bowel sounds , non tender, not distended. No palpable hepatosplenomegaly.  Extremity: no pedal edema PSYCH: alert & oriented x 3 with fluent speech NEURO: no focal motor/sensory deficits  LABORATORY DATA:  I have reviewed the data as listed  . CBC Latest Ref Rng & Units 03/27/2020 03/27/2020 01/30/2020  WBC 4.0 - 10.5 K/uL 3.0(L) - 4.7  Hemoglobin 12.0 -  15.0 g/dL 16.5(H) - 15.0  Hematocrit 34.0 - 46.6 % 47.8(H) 48.4(H) 44.3  Platelets 150 - 400 K/uL 169 - 56(L)    . CMP Latest Ref Rng & Units 03/27/2020 01/30/2020 10/15/2012  Glucose 70 - 99 mg/dL 95 99 91  BUN 8 - 23 mg/dL 11 11 11   Creatinine 0.44 - 1.00 mg/dL 0.89 0.77 0.84  Sodium 135 - 145 mmol/L 142 140 142  Potassium 3.5 - 5.1 mmol/L 3.1(L) 2.9(L) 3.6  Chloride 98 - 111 mmol/L 106 108 107  CO2 22 - 32 mmol/L 28 21(L) 26  Calcium 8.9 - 10.3 mg/dL 9.4 9.0 9.3  Total Protein 6.5 - 8.1 g/dL 8.6(H) - -  Total Bilirubin 0.3 - 1.2 mg/dL 0.7 - -  Alkaline Phos 38 - 126 U/L 73 - -  AST 15 - 41 U/L 17 - -  ALT 0 - 44 U/L 9 - -     RADIOGRAPHIC STUDIES: I have personally reviewed the radiological images as listed and agreed with the findings in the report. No results found.  ASSESSMENT & PLAN:   75 yo female with achondroplasia with   1) Extensive Left lower extremity DVT ? May thurner syndrome PLAN: *** -Advised pt that smoking was a factor in her blood clot, but may not have been the only cause.  -Recommend complete smoking cessation as it increases the risk of repeat clots. Pt is okay quitting without Nicotine replacement.   FOLLOW UP: ***  The total time spent in the appt was *** minutes and more than 50% was on counseling and direct patient cares.  All of the patient's questions were answered with apparent satisfaction. The patient knows to call the clinic with any problems, questions or concerns.    Sullivan Lone MD [place] AAHIVMS [facility] Hematology/Oncology Physician [facility]  (Office):       [phone] (Work cell):  [phone] (Fax):           [phone]  04/18/2020 7:49 AM  I, Yevette Edwards, am acting as a scribe for Dr. Sullivan Lone.   {Add Barista Statement}

## 2020-04-20 ENCOUNTER — Other Ambulatory Visit: Payer: Self-pay

## 2020-04-20 ENCOUNTER — Ambulatory Visit (HOSPITAL_COMMUNITY): Admission: RE | Admit: 2020-04-20 | Payer: Medicare HMO | Source: Ambulatory Visit

## 2020-04-20 ENCOUNTER — Ambulatory Visit (HOSPITAL_COMMUNITY)
Admission: RE | Admit: 2020-04-20 | Discharge: 2020-04-20 | Disposition: A | Discharge status: Home / Self Care | Payer: Medicare HMO | Source: Ambulatory Visit | Attending: Hematology | Admitting: Hematology

## 2020-04-20 DIAGNOSIS — I82422 Acute embolism and thrombosis of left iliac vein: Secondary | ICD-10-CM | POA: Insufficient documentation

## 2020-04-20 MED ORDER — IOHEXOL 300 MG/ML  SOLN
100.0000 mL | Freq: Once | INTRAMUSCULAR | Status: AC | PRN
  Administered 2020-04-20: 100 mL via INTRAVENOUS

## 2020-04-27 ENCOUNTER — Telehealth: Payer: Self-pay | Admitting: Hematology

## 2020-04-27 ENCOUNTER — Other Ambulatory Visit: Payer: Self-pay

## 2020-04-27 ENCOUNTER — Inpatient Hospital Stay: Payer: Medicare HMO

## 2020-04-27 ENCOUNTER — Inpatient Hospital Stay: Payer: Medicare HMO | Attending: Hematology | Admitting: Hematology

## 2020-04-27 VITALS — BP 145/63 | HR 74 | Temp 98.9°F | Resp 18 | Wt 131.3 lb

## 2020-04-27 DIAGNOSIS — E039 Hypothyroidism, unspecified: Secondary | ICD-10-CM | POA: Insufficient documentation

## 2020-04-27 DIAGNOSIS — Z7901 Long term (current) use of anticoagulants: Secondary | ICD-10-CM | POA: Insufficient documentation

## 2020-04-27 DIAGNOSIS — I82422 Acute embolism and thrombosis of left iliac vein: Secondary | ICD-10-CM

## 2020-04-27 DIAGNOSIS — Z23 Encounter for immunization: Secondary | ICD-10-CM

## 2020-04-27 DIAGNOSIS — Z86718 Personal history of other venous thrombosis and embolism: Secondary | ICD-10-CM | POA: Insufficient documentation

## 2020-04-27 DIAGNOSIS — Z79899 Other long term (current) drug therapy: Secondary | ICD-10-CM | POA: Insufficient documentation

## 2020-04-27 DIAGNOSIS — Q774 Achondroplasia: Secondary | ICD-10-CM | POA: Diagnosis not present

## 2020-04-27 DIAGNOSIS — K219 Gastro-esophageal reflux disease without esophagitis: Secondary | ICD-10-CM | POA: Diagnosis not present

## 2020-04-27 DIAGNOSIS — F17210 Nicotine dependence, cigarettes, uncomplicated: Secondary | ICD-10-CM | POA: Diagnosis not present

## 2020-04-27 DIAGNOSIS — I10 Essential (primary) hypertension: Secondary | ICD-10-CM | POA: Insufficient documentation

## 2020-04-27 DIAGNOSIS — R600 Localized edema: Secondary | ICD-10-CM | POA: Diagnosis not present

## 2020-04-27 NOTE — Telephone Encounter (Signed)
Scheduled appointments per 1/13 los. Spoke to patient who is aware of appointments dates and times.  

## 2020-04-27 NOTE — Progress Notes (Signed)
   Covid-19 Vaccination Clinic  Name:  Kaitlyn Barnes    MRN: [id number] DOB: Jan 03, 1946  04/27/2020  Kaitlyn Barnes was observed post Covid-19 immunization for 15 minutes without incident. She was provided with Vaccine Information Sheet and instruction to access the V-Safe system.   Kaitlyn Barnes was instructed to call 911 with any severe reactions post vaccine: Marland Kitchen Difficulty breathing  . Swelling of face and throat  . A fast heartbeat  . A bad rash all over body  . Dizziness and weakness   Immunizations Administered    Name Date Dose VIS Date Route   Pfizer COVID-19 Vaccine 04/27/2020  1:04 PM 0.3 mL 02/02/2020 Intramuscular   Manufacturer: [place]   Lot: [id number]   NDC: [address]-6

## 2020-04-27 NOTE — Progress Notes (Signed)
HEMATOLOGY/ONCOLOGY CONSULTATION NOTE  Date of Service: 04/27/2020  Patient Care Team: Audley Hose, MD as PCP - General (Internal Medicine)  CHIEF COMPLAINTS/PURPOSE OF CONSULTATION:  DVT  HISTORY OF PRESENTING ILLNESS:    Kaitlyn Barnes is a wonderful 75 y.o. female who has been referred to [place] by Dr. Maia Petties for evaluation and management of DVT. The pt reports that she is doing well overall.   The pt reports that she had no blood clots prior to her event in October. She has no family history of blood clots, blood disorders, or cancers. Pt began to exercise and lost over 15 lbs over the last year. During one of her exercise sessions the pt felt a sharp pain in her left thigh. The discomfort was so bothersome that she struggled to walk. After this occurred she stretched, took two Tylenol, and laid down. When the patient awoke she noticed that her entire left leg, from her groid to her ankle, was swollen. Two days later she went to the ED where they completed an ultrasound of her left leg. Pt denies any vaccinations, injury to the area, long-distance travel, or surgery directly before her clot. Pt was started on Actonel in the months before her clot, but asked to stop due to the medication causing fevers, chills, nausea, and vomiting. She held the medication about three weeks prior to the clotting event. Pt was smoking at the time of her blood clot.  She still has some left leg swelling and has been using compression socks. She feels that her leg swelling is better by at least 50%. Pt has also continued smoking.   Pt has a history of achondroplasia and hypothyroidism. Pt is unsure why she has chronic hypokalemia or thrombocytopenia. She has continued Folic acid and Vitamin B12 replacement since her hospital visit. Pt is up to date with age appropriate cancer screenings. Pt has been struggling emotionally with the passing of her mother last year, as they were really close.  Of  note prior to the patient's visit today, pt has had [place] Lower Extremity Venous Left completed on 01/30/2020 with results revealing "RIGHT: - No evidence of common femoral or external iliac vein obstruction. LEFT: - Findings consistent with acute deep vein thrombosis involving the left common femoral vein, SF junction, left femoral vein, left popliteal vein, left posterior tibial veins, left peroneal veins, left soleal veins, left gastrocnemius veins, and EIV, mid to distal CIV."  Most recent lab results (02/17/2020) of CBC w/diff & CMP is as follows: all values are WNL except for WBC at 3.4K, MCV at 106.1, MCH at 36.6, Potassium at 3.2, Globulin at 3.8. 02/17/2020 Homocysteine at 33.7 02/17/2020 Folate at 2.8  On review of systems, pt reports left left swelling and denies unexpected weight loss, abnormal vaginal discharge, abdominal pain, urinary habit changes, bowel habit changes, fevers, chills, night sweats, chest pain SOB and any other symptoms.   On PMHx the pt reports Achondroplasia, Hypothyroidism, HTN. On Social Hx the pt reports that she smokes 3-5 cigarettes per day. She denies any heavier smoking history previously.  INTERVAL HISTORY:  Kaitlyn Barnes is a wonderful 75 y.o. female who is here for evaluation and management of DVT. The patient's last visit with [place] was on 03/27/2020. The pt reports that she is doing well overall.  The pt reports her LLE swelling has improved significantly. Tolerating anticoagulation without any issues.  Of note since the patient's last visit, pt has had CT Venogram Abd/Pel ([id number])  completed on 04/20/2020 with results revealing "1. No definite explanation for patient's worsening left lower extremity pain and edema. Specifically, no evidence of pelvic or caval DVT, narrowing or thrombosis. 2. Tiny (approximately 1.5 cm) periumbilical hernia containing a short segment of the small bowel, not resulting in enteric obstruction. 3. Moderate to large  amount of eccentric mixed calcified and noncalcified atherosclerotic plaque involving the bilateral common and external iliac arteries, incompletely evaluated on present examination though may result in a hemodynamically significant narrowings bilaterally. Correlation for symptoms of PAD is advised. Further evaluation with the acquisition of ABIs could be performed as indicated. Aortic Atherosclerosis (ICD10-I70.0). 4. Post L2-L5 laminectomies."  Lab results (03/27/2020) of CBC w/diff and CMP is as follows: all values are WNL except for WBC at 3.0K, Hgb at 16.5, HCT at 48.4, MCV at 108.0, MCH at 36.8, RDW at 16.4, Neutro Abs at 1.4K, Potassium at 3.1, Total Protein at 8.6. 03/27/2020 Folate Hemolysate at 269.0, Folate RBC at 563 03/27/2020 Factor 5 leiden is "Not Detected" 03/27/2020 Immature PLT Fract at 3.1 03/27/2020 Vitamin B12 at 162 03/27/2020 PTT Lupus Anticoagulant - positive 03/27/2020 Beta-2 Glyco I IgG at <9, Beta-2-Glycoprotein I IgM at <9, Beta-2-Glycoprotein I IgA at <9 03/27/2020 Anticardiolipin IgG at <9, Anticardiolipin IgM at 23, Anticardiolipin IgA at <9 03/27/2020 Protein C total at 81 03/27/2020 Protein C activity at 99 03/27/2020 Protein S Ag total at 96, Protein S Ag free at 103 03/27/2020 AntiThromb III Func at 90 03/27/2020 Prothrombin gene mutation is "Not Detected" 03/27/2020 dRVVT Mix at 56.1 03/27/2020 dRVVT Confirm at 1.4  MEDICAL HISTORY:  Past Medical History:  Diagnosis Date  . Allergy   . Arthritis   . Complication of anesthesia   . Dysrhythmia    irregular  . GERD (gastroesophageal reflux disease)   . Hypertension   . Hypothyroidism   . PONV (postoperative nausea and vomiting)    nausea after C-Section  . Seasonal allergies     SURGICAL HISTORY: Past Surgical History:  Procedure Laterality Date  . ABDOMINAL HYSTERECTOMY    . BACK SURGERY  10/21/2012   Dr.Betero  . CESAREAN SECTION    . DENTAL SURGERY     removed    SOCIAL HISTORY: Social  History   Socioeconomic History  . Marital status: Widowed    Spouse name: Not on file  . Number of children: Not on file  . Years of education: Not on file  . Highest education level: Not on file  Occupational History  . Not on file  Tobacco Use  . Smoking status: Current Every Day Smoker    Packs/day: 0.30    Years: 20.00    Pack years: 6.00    Types: Cigarettes  . Smokeless tobacco: Never Used  Substance and Sexual Activity  . Alcohol use: Yes    Alcohol/week: 3.0 standard drinks    Types: 3 Glasses of wine per week  . Drug use: No  . Sexual activity: Not on file  Other Topics Concern  . Not on file  Social History Narrative  . Not on file   Social Determinants of Health   Financial Resource Strain: Not on file  Food Insecurity: Not on file  Transportation Needs: Not on file  Physical Activity: Not on file  Stress: Not on file  Social Connections: Not on file  Intimate Partner Violence: Not on file    FAMILY HISTORY: Family History  Problem Relation Age of Onset  . Colon cancer Neg Hx  ALLERGIES:  is allergic to other, dog epithelium allergy skin test, and sulfa antibiotics.  MEDICATIONS:  Current Outpatient Medications  Medication Sig Dispense Refill  . amLODipine-benazepril (LOTREL) 10-20 MG per capsule Take 1 capsule by mouth daily.    Marland Kitchen atorvastatin (LIPITOR) 20 MG tablet Take 20 mg by mouth daily.    . cetirizine (ZYRTEC) 10 MG tablet Take 10 mg by mouth daily.    . GuaiFENesin (MUCINEX PO) Take 1 tablet by mouth daily.    Marland Kitchen levothyroxine (SYNTHROID, LEVOTHROID) 100 MCG tablet Take 100 mcg by mouth daily.      Marland Kitchen omeprazole (PRILOSEC) 20 MG capsule Take 20 mg by mouth daily.      . potassium gluconate 595 MG TABS tablet Take 595 mg by mouth daily.    Marland Kitchen RIVAROXABAN (XARELTO) VTE STARTER PACK (15 & 20 MG TABLETS) Follow package directions: Take one 15mg  tablet by mouth twice a day. On day 22, switch to one 20mg  tablet once a day. Take with food. 51  each 0   No current facility-administered medications for this visit.    REVIEW OF SYSTEMS:   A 10+ POINT REVIEW OF SYSTEMS WAS OBTAINED including neurology, dermatology, psychiatry, cardiac, respiratory, lymph, extremities, GI, GU, Musculoskeletal, constitutional, breasts, reproductive, HEENT.  All pertinent positives are noted in the HPI.  All others are negative.   PHYSICAL EXAMINATION: ECOG PERFORMANCE STATUS: 1 - Symptomatic but completely ambulatory  . Vitals:   04/27/20 1050  BP: (!) 145/63  Pulse: 74  Resp: 18  Temp: 98.9 F (37.2 C)  SpO2: 100%   Filed Weights   04/27/20 1050  Weight: 131 lb 4.8 oz (59.6 kg)   .Body mass index is 36.93 kg/m.  NAD GENERAL:alert, in no acute distress and comfortable SKIN: no acute rashes, no significant lesions EYES: conjunctiva are pink and non-injected, sclera anicteric OROPHARYNX: MMM, no exudates, no oropharyngeal erythema or ulceration NECK: supple, no JVD LYMPH:  no palpable lymphadenopathy in the cervical, axillary or inguinal regions LUNGS: clear to auscultation b/l with normal respiratory effort HEART: regular rate & rhythm ABDOMEN:  normoactive bowel sounds , non tender, not distended. No palpable hepatosplenomegaly.  Extremity: no pedal edema PSYCH: alert & oriented x 3 with fluent speech NEURO: no focal motor/sensory deficits  LABORATORY DATA:  I have reviewed the data as listed  . CBC Latest Ref Rng & Units 03/27/2020 03/27/2020 01/30/2020  WBC 4.0 - 10.5 K/uL 3.0(L) - 4.7  Hemoglobin 12.0 - 15.0 g/dL 16.5(H) - 15.0  Hematocrit 34.0 - 46.6 % 47.8(H) 48.4(H) 44.3  Platelets 150 - 400 K/uL 169 - 56(L)    . CMP Latest Ref Rng & Units 03/27/2020 01/30/2020 10/15/2012  Glucose 70 - 99 mg/dL 95 99 91  BUN 8 - 23 mg/dL 11 11 11   Creatinine 0.44 - 1.00 mg/dL 0.89 0.77 0.84  Sodium 135 - 145 mmol/L 142 140 142  Potassium 3.5 - 5.1 mmol/L 3.1(L) 2.9(L) 3.6  Chloride 98 - 111 mmol/L 106 108 107  CO2 22 - 32 mmol/L  28 21(L) 26  Calcium 8.9 - 10.3 mg/dL 9.4 9.0 9.3  Total Protein 6.5 - 8.1 g/dL 8.6(H) - -  Total Bilirubin 0.3 - 1.2 mg/dL 0.7 - -  Alkaline Phos 38 - 126 U/L 73 - -  AST 15 - 41 U/L 17 - -  ALT 0 - 44 U/L 9 - -     RADIOGRAPHIC STUDIES: I have personally reviewed the radiological images as listed and agreed with the  findings in the report. CT VENOGRAM ABD/PEL  Result Date: 04/20/2020 CLINICAL DATA:  History achondroplasia with recent diagnosis left lower extremity DVT, now with worsening symptoms. EXAM: CT ABDOMEN AND PELVIS WITH CONTRAST TECHNIQUE: Multidetector CT imaging of the abdomen and pelvis was performed using the standard protocol following bolus administration of intravenous contrast. CONTRAST:  177mL OMNIPAQUE IOHEXOL 300 MG/ML  SOLN COMPARISON:  None. FINDINGS: Lower chest: Limited visualization the lower thorax demonstrates minimal dependent subpleural ground-glass atelectasis. No discrete focal airspace opacities. No pleural effusion or pneumothorax. Normal heart size.  No pericardial effusion. Hepatobiliary: Normal hepatic contour. No discrete hepatic lesions. Normal appearance of the gallbladder given degree of distention. No intra or extrahepatic biliary ductal dilatation. No ascites. Pancreas: Normal appearance of the pancreas. Spleen: Normal appearance of the spleen. Adrenals/Urinary Tract: There is symmetric enhancement and excretion of the bilateral kidneys. Note is made of an approximately 1.3 cm hypoattenuating nonenhancing cyst involving the anterior inferior aspect of the right kidney. Subcentimeter hypoattenuating lesions within the superior pole of the left kidney are too small to adequately characterize though favored to represent additional renal cysts. No definite renal stones on this postcontrast examination. No urinary obstruction or perinephric stranding. Normal appearance of the bilateral adrenal glands. Normal appearance of the urinary bladder given degree  distention. Stomach/Bowel: Ingested enteric contrast extends to the level of the rectum. Moderate colonic stool burden without evidence of enteric obstruction. A tiny portion of the small bowel is contained within a tiny periumbilical hernia which measures approximately 1.5 x 1.3 x 1.2 cm (axial image 40, series 2; sagittal image 90, series 4), not resulting in enteric obstruction. Normal appearance of the terminal ileum. The appendix is not confidently identified however there is no pericecal inflammatory change. Vascular/Lymphatic: Moderate amount of mixed calcified and noncalcified atherosclerotic plaque throughout a normal caliber abdominal aorta, not resulting in hemodynamically significant stenosis. There is a moderate to large amount of eccentric mixed calcified and noncalcified atherosclerotic plaque involving the bilateral common and external iliac arteries, incompletely evaluated on present examination though may result in a hemodynamically significant narrowings bilaterally. The major branch vessels of the abdominal aorta appear patent on this non CTA examination. The IVC and pelvic venous systems appear patent without evidence of thrombosis, vessel expansion or Peri venous stranding. Additionally, there is no definitive development of significantly hypertrophied retroperitoneal venous collaterals. Scattered retroperitoneal lymph nodes are numerous though individually not enlarged by size criteria, presumably reactive in etiology. No bulky retroperitoneal, mesenteric, pelvic or inguinal lymphadenopathy. Reproductive: Post hysterectomy. No discrete adnexal lesion. No free fluid within the pelvic cul-de-sac. Other: Periumbilical hernia containing a portion of enteric obstruction. Regional soft tissues appear otherwise normal. Musculoskeletal: No acute or aggressive osseous abnormalities. Post L2-L5 laminectomy. Grade 1 anterolisthesis of L3 upon L4 and L4 upon L5. No definite pars defects though note is  made of moderate to severe bilateral facet degenerative change of the lower lumbar. IMPRESSION: 1. No definite explanation for patient's worsening left lower extremity pain and edema. Specifically, no evidence of pelvic or caval DVT, narrowing or thrombosis. 2. Tiny (approximately 1.5 cm) periumbilical hernia containing a short segment of the small bowel, not resulting in enteric obstruction. 3. Moderate to large amount of eccentric mixed calcified and noncalcified atherosclerotic plaque involving the bilateral common and external iliac arteries, incompletely evaluated on present examination though may result in a hemodynamically significant narrowings bilaterally. Correlation for symptoms of PAD is advised. Further evaluation with the acquisition of ABIs could be performed as indicated. Aortic  Atherosclerosis (ICD10-I70.0). 4. Post L2-L5 laminectomies. Electronically Signed   By: Sandi Mariscal M.D.   On: 04/20/2020 12:16    ASSESSMENT & PLAN:   75 yo female with achondroplasia with   1) Extensive Left lower extremity DVT ? May thurner syndrome PLAN: -discussed her labs done as a part of her hypercoagulability workup. -+ve lupus anticoagulant - ?truly positive vs false positive due to anticoagulation -IgM cardioloipin ab 23-- elevated -will need rpt APLA labs in atleast 12 weeks later -CT venogram discussed  -f/u with vascular surgery as per appointment and to evaluate for PAD -Recommend complete smoking cessation as it increases the risk of repeat clots. Pt is okay quitting without Nicotine replacement. -continue Xarelto 20mg  po daily  FOLLOW UP: Covid booster vaccine today RTC with Dr Irene Limbo with labs in 4 months   The total time spent in the appt was 30 minutes and more than 50% was on counseling and direct patient cares.  All of the patient's questions were answered with apparent satisfaction. The patient knows to call the clinic with any problems, questions or concerns.    Sullivan Lone MD  [place] AAHIVMS [facility] Hematology/Oncology Physician [facility]  (Office):       [phone] (Work cell):  [phone] (Fax):           [phone]  04/27/2020 2:23 AM  I, Yevette Edwards, am acting as a scribe for Dr. Sullivan Lone.   .I have reviewed the above documentation for accuracy and completeness, and I agree with the above. Brunetta Genera MD

## 2020-05-22 ENCOUNTER — Other Ambulatory Visit: Payer: Self-pay

## 2020-05-22 ENCOUNTER — Encounter: Payer: Self-pay | Admitting: Surgery

## 2020-05-22 ENCOUNTER — Ambulatory Visit: Payer: Medicare HMO | Admitting: Surgery

## 2020-05-22 VITALS — BP 140/76 | HR 78 | Temp 98.4°F | Resp 20 | Ht <= 58 in | Wt 132.0 lb

## 2020-05-22 DIAGNOSIS — I82422 Acute embolism and thrombosis of left iliac vein: Secondary | ICD-10-CM

## 2020-05-22 NOTE — Progress Notes (Signed)
[facility] [place]  Patient name: Annalena Piatt MRN: [id number] DOB: Feb 08, 1946 Sex: female   REASON FOR VISIT:    Follow up  [place]:    Marrah Vanevery is a 75 y.o. female who initially saw Dr. Carlis Abbott on 02/08/2020.  At that time she had a 2-week history of a left leg DVT with involvement of the iliac veins.  She was started on Xarelto which improved her symptoms by about 25%.  This was her first thromboembolic event.  We had discussions regarding the possibility of mechanical thrombectomy.  There was concern over the length of the device.  Ultimately, the patient decided she wanted to see how she would do with Xarelto and compression stockings.  She is back today wanting to know when she can get off of her blood thinners.  She states that her legs are not bothering her. She has to wear compression socks however she is having difficulty with them sliding down.  We tried to determine whether or not this was a provoked clot or not.  She does not describe any precipitating factors.   PAST MEDICAL HISTORY:   Past Medical History:  Diagnosis Date  . Allergy   . Arthritis   . Complication of anesthesia   . Dysrhythmia    irregular  . GERD (gastroesophageal reflux disease)   . Hypertension   . Hypothyroidism   . PONV (postoperative nausea and vomiting)    nausea after C-Section  . Seasonal allergies      FAMILY HISTORY:   Family History  Problem Relation Age of Onset  . Colon cancer Neg Hx     SOCIAL HISTORY:   Social History   Tobacco Use  . Smoking status: Current Every Day Smoker    Packs/day: 0.30    Years: 20.00    Pack years: 6.00    Types: Cigarettes  . Smokeless tobacco: Never Used  Substance Use Topics  . Alcohol use: Yes    Alcohol/week: 3.0 standard drinks    Types: 3 Glasses of wine per week     ALLERGIES:   Allergies  Allergen Reactions  . Other Hives  and Cough    Dogs , Cats, Rag Weed  . Dog Epithelium Allergy Skin Test   . Sulfa Antibiotics      CURRENT MEDICATIONS:   Current Outpatient Medications  Medication Sig Dispense Refill  . alendronate (FOSAMAX) 70 MG tablet alendronate 70 mg tablet    . amLODipine-benazepril (LOTREL) 10-20 MG per capsule Take 1 capsule by mouth daily.    Marland Kitchen atorvastatin (LIPITOR) 20 MG tablet Take 20 mg by mouth daily.    . cetirizine (ZYRTEC) 10 MG tablet Take 10 mg by mouth daily.    . Cholecalciferol 1.25 MG (50000 UT) capsule cholecalciferol (vitamin D3) 1,250 mcg (50,000 unit) capsule  TAKE 1 CAPSULE EVERY WEEK BY ORAL ROUTE.    . cyanocobalamin 1000 MCG tablet cyanocobalamin (vit B-12) 1,000 mcg tablet  Take 1 tablet every day by oral route.    . ergocalciferol (VITAMIN D2) 1.25 MG (50000 UT) capsule ergocalciferol (vitamin D2) 1,250 mcg (50,000 unit) capsule  TAKE 1 CAPSULE BY MOUTH ONE TIME PER WEEK    . fluticasone (FLONASE) 50 MCG/ACT nasal spray fluticasone propionate 50 mcg/actuation nasal spray,suspension    . folic acid (FOLVITE) 1 MG tablet folic acid 1 mg tablet    . GuaiFENesin (MUCINEX PO) Take 1 tablet by mouth daily.    Marland Kitchen ibandronate (  BONIVA) 150 MG tablet ibandronate 150 mg tablet    . levothyroxine (SYNTHROID) 112 MCG tablet levothyroxine 112 mcg tablet    . levothyroxine (SYNTHROID, LEVOTHROID) 100 MCG tablet Take 100 mcg by mouth daily.      Marland Kitchen omeprazole (PRILOSEC) 20 MG capsule Take 20 mg by mouth daily.      . potassium chloride SA (KLOR-CON) 20 MEQ tablet potassium chloride ER 20 mEq tablet,extended release  take 1 tab per day    . potassium gluconate 595 MG TABS tablet Take 595 mg by mouth daily.    . rivaroxaban (XARELTO) 20 MG TABS tablet Xarelto 20 mg tablet  Take 1 tablet every day by oral route for 30 days.    Marland Kitchen RIVAROXABAN (XARELTO) VTE STARTER PACK (15 & 20 MG TABLETS) Follow package directions: Take one 15mg  tablet by mouth twice a day. On day 22, switch to one 20mg   tablet once a day. Take with food. (Patient not taking: Reported on 04/27/2020) 51 each 0  . rosuvastatin (CRESTOR) 10 MG tablet rosuvastatin 10 mg tablet  TAKE 1 TABLET EVERY DAY BY ORAL ROUTE IN THE EVENING     No current facility-administered medications for this visit.    REVIEW OF SYSTEMS:   [X]  denotes positive finding, [ ]  denotes negative finding Cardiac  Comments:  Chest pain or chest pressure:    Shortness of breath upon exertion:    Short of breath when lying flat:    Irregular heart rhythm:        Vascular    Pain in calf, thigh, or hip brought on by ambulation:    Pain in feet at night that wakes you up from your sleep:     Blood clot in your veins:    Leg swelling:  x       Pulmonary    Oxygen at home:    Productive cough:     Wheezing:         Neurologic    Sudden weakness in arms or legs:     Sudden numbness in arms or legs:     Sudden onset of difficulty speaking or slurred speech:    Temporary loss of vision in one eye:     Problems with dizziness:         Gastrointestinal    Blood in stool:     Vomited blood:         Genitourinary    Burning when urinating:     Blood in urine:        Psychiatric    Major depression:         Hematologic    Bleeding problems:    Problems with blood clotting too easily:        Skin    Rashes or ulcers:        Constitutional    Fever or chills:      PHYSICAL EXAM:   There were no vitals filed for this visit.  GENERAL: The patient is a well-nourished female, in no acute distress. The vital signs are documented above. CARDIAC: There is a regular rate and rhythm.  PULMONARY: Non-labored respirations MUSCULOSKELETAL: There are no major deformities or cyanosis. NEUROLOGIC: No focal weakness or paresthesias are detected. SKIN: There are no ulcers or rashes noted. PSYCHIATRIC: The patient has a normal affect.  STUDIES:   I have reviewed her CT venogram with the following findings:  1. No definite  explanation for patient's worsening left lower extremity pain and  edema. Specifically, no evidence of pelvic or caval DVT, narrowing or thrombosis. 2. Tiny (approximately 1.5 cm) periumbilical hernia containing a short segment of the small bowel, not resulting in enteric obstruction. 3. Moderate to large amount of eccentric mixed calcified and noncalcified atherosclerotic plaque involving the bilateral common and external iliac arteries, incompletely evaluated on present examination though may result in a hemodynamically significant narrowings bilaterally. Correlation for symptoms of PAD is advised. Further evaluation with the acquisition of ABIs could be performed as indicated. Aortic Atherosclerosis (ICD10-I70.0). 4. Post L2-L5 laminectomies.  MEDICAL ISSUES:   Left leg DVT: This appears to be unprovoked.  She elected to be treated with anticoagulation and compression.  She has done well with this.  She does have some issues with the compression stocks sliding down.  I have recommended that she go to [place] to see if we can get a custom pair for her.  Since this is an unprovoked clot, I think she needs a hypercoagulable work-up in order to determine the duration of her anticoagulation.  I am making a referral to hematology.  I told the patient to stop her anticoagulation approximately 1 week prior to seeing them so that blood work can be done and ultimately a decision can be made on the duration of her Xarelto.  She will follow-up as needed with me.    Leia Alf, MD, FACS [facility] [phone] Pager [phone]

## 2020-05-26 ENCOUNTER — Telehealth: Payer: Self-pay | Admitting: Hematology

## 2020-05-26 NOTE — Telephone Encounter (Signed)
Called pt per 2/10 sch msg - pt Is aware of appt date and time .

## 2020-06-09 ENCOUNTER — Inpatient Hospital Stay: Payer: Medicare HMO | Attending: Hematology | Admitting: Hematology

## 2020-06-09 ENCOUNTER — Other Ambulatory Visit: Payer: Self-pay

## 2020-06-15 NOTE — Progress Notes (Signed)
This encounter was created in error - please disregard.

## 2020-08-24 NOTE — Progress Notes (Signed)
HEMATOLOGY/ONCOLOGY CONSULTATION NOTE  Date of Service: 08/25/2020  Patient Care Team: Audley Hose, MD as PCP - General (Internal Medicine)  CHIEF COMPLAINTS/PURPOSE OF CONSULTATION:  DVT  HISTORY OF PRESENTING ILLNESS:    Kaitlyn Barnes is a wonderful 75 y.o. female who has been referred to [place] by Dr. Maia Petties for evaluation and management of DVT. The pt reports that she is doing well overall.   The pt reports that she had no blood clots prior to her event in October. She has no family history of blood clots, blood disorders, or cancers. Pt began to exercise and lost over 15 lbs over the last year. During one of her exercise sessions the pt felt a sharp pain in her left thigh. The discomfort was so bothersome that she struggled to walk. After this occurred she stretched, took two Tylenol, and laid down. When the patient awoke she noticed that her entire left leg, from her groid to her ankle, was swollen. Two days later she went to the ED where they completed an ultrasound of her left leg. Pt denies any vaccinations, injury to the area, long-distance travel, or surgery directly before her clot. Pt was started on Actonel in the months before her clot, but asked to stop due to the medication causing fevers, chills, nausea, and vomiting. She held the medication about three weeks prior to the clotting event. Pt was smoking at the time of her blood clot.  She still has some left leg swelling and has been using compression socks. She feels that her leg swelling is better by at least 50%. Pt has also continued smoking.   Pt has a history of achondroplasia and hypothyroidism. Pt is unsure why she has chronic hypokalemia or thrombocytopenia. She has continued Folic acid and Vitamin B12 replacement since her hospital visit. Pt is up to date with age appropriate cancer screenings. Pt has been struggling emotionally with the passing of her mother last year, as they were really close.  Of  note prior to the patient's visit today, pt has had [place] Lower Extremity Venous Left completed on 01/30/2020 with results revealing "RIGHT: - No evidence of common femoral or external iliac vein obstruction. LEFT: - Findings consistent with acute deep vein thrombosis involving the left common femoral vein, SF junction, left femoral vein, left popliteal vein, left posterior tibial veins, left peroneal veins, left soleal veins, left gastrocnemius veins, and EIV, mid to distal CIV."  Most recent lab results (02/17/2020) of CBC w/diff & CMP is as follows: all values are WNL except for WBC at 3.4K, MCV at 106.1, MCH at 36.6, Potassium at 3.2, Globulin at 3.8. 02/17/2020 Homocysteine at 33.7 02/17/2020 Folate at 2.8  On review of systems, pt reports left left swelling and denies unexpected weight loss, abnormal vaginal discharge, abdominal pain, urinary habit changes, bowel habit changes, fevers, chills, night sweats, chest pain SOB and any other symptoms.   On PMHx the pt reports Achondroplasia, Hypothyroidism, HTN. On Social Hx the pt reports that she smokes 3-5 cigarettes per day. She denies any heavier smoking history previously.  INTERVAL HISTORY:  Kaitlyn Barnes is a wonderful 75 y.o. female who is here for evaluation and management of DVT. The patient's last visit with [place] was on 04/27/2020. The pt reports that she is doing well overall.  The pt reports that she saw Dr. Trula Slade since the last visit and was offered a mechanical thrombectomy. The pt did not desire this option. She was instructed to  stop her Xarelto for two days or to being here today so that she could get the Lupus testing.  The pt reports that she has been dealing with sickness in the family with her granddaughter being in the hospital with a severe case of allergies. The pt notes her significant other had his leg amputated and recently had a fall as well. The pt notes that things are gradually improving. The pt notes no issues  with her leg, no pain at all anymore. She has been exercising her legs in the chair. She denies any swelling or pain. The pt denies any concern over blood supply issues. She notes that the compression socks have been slipping and do not fit her well. She notes custom compression socks would be very costly. The pt notes she can currently walk as much as she desires. The pt stays very active. The pt notes she is still smoking.  The pt notes that she had been taking her synthroid with other medications and not as directed.   Lab results today 08/25/2020 of CBC w/diff and CMP is as follows: all values are WNL except for WBC of 2.8K, RBC of 3.50, MCV of 106.9, MCH of 37.7, RDW of 15.9, nRBC of 0.7, Neutro Abs of 1.3K, Potassium of 3.0, BUN of 7. 08/25/2020 Lupus anticoagulant is negative 08/25/2020 Anticardiolipin antibodies IgM low-med persistently +ve at 25  On review of systems, pt reports stress and denies abdominal pain, leg pain, leg swelling, and any other symptoms.  MEDICAL HISTORY:  Past Medical History:  Diagnosis Date  . Allergy   . Arthritis   . Complication of anesthesia   . DVT (deep venous thrombosis) ([place])   . Dysrhythmia    irregular  . GERD (gastroesophageal reflux disease)   . Hypertension   . Hypothyroidism   . PONV (postoperative nausea and vomiting)    nausea after C-Section  . Seasonal allergies     SURGICAL HISTORY: Past Surgical History:  Procedure Laterality Date  . ABDOMINAL HYSTERECTOMY    . BACK SURGERY  10/21/2012   Dr.Betero  . CESAREAN SECTION    . DENTAL SURGERY     removed    SOCIAL HISTORY: Social History   Socioeconomic History  . Marital status: Widowed    Spouse name: Not on file  . Number of children: Not on file  . Years of education: Not on file  . Highest education level: Not on file  Occupational History  . Not on file  Tobacco Use  . Smoking status: Current Every Day Smoker    Packs/day: 0.25    Years: 20.00    Pack years: 5.00     Types: Cigarettes  . Smokeless tobacco: Never Used  Vaping Use  . Vaping Use: Never used  Substance and Sexual Activity  . Alcohol use: Yes    Alcohol/week: 3.0 standard drinks    Types: 3 Glasses of wine per week  . Drug use: No  . Sexual activity: Not on file  Other Topics Concern  . Not on file  Social History Narrative  . Not on file   Social Determinants of Health   Financial Resource Strain: Not on file  Food Insecurity: Not on file  Transportation Needs: Not on file  Physical Activity: Not on file  Stress: Not on file  Social Connections: Not on file  Intimate Partner Violence: Not on file    FAMILY HISTORY: Family History  Problem Relation Age of Onset  . Colon cancer Neg  Hx     ALLERGIES:  is allergic to other, dog epithelium allergy skin test, and sulfa antibiotics.  MEDICATIONS:  Current Outpatient Medications  Medication Sig Dispense Refill  . alendronate (FOSAMAX) 70 MG tablet alendronate 70 mg tablet (Patient not taking: Reported on 05/22/2020)    . amLODipine-benazepril (LOTREL) 10-20 MG per capsule Take 1 capsule by mouth daily.    . cetirizine (ZYRTEC) 10 MG tablet Take 10 mg by mouth daily.    . Cholecalciferol 1.25 MG (50000 UT) capsule cholecalciferol (vitamin D3) 1,250 mcg (50,000 unit) capsule  TAKE 1 CAPSULE EVERY WEEK BY ORAL ROUTE.    . cyanocobalamin 1000 MCG tablet cyanocobalamin (vit B-12) 1,000 mcg tablet  Take 1 tablet every day by oral route.    . ergocalciferol (VITAMIN D2) 1.25 MG (50000 UT) capsule ergocalciferol (vitamin D2) 1,250 mcg (50,000 unit) capsule  TAKE 1 CAPSULE BY MOUTH ONE TIME PER WEEK    . fluticasone (FLONASE) 50 MCG/ACT nasal spray fluticasone propionate 50 mcg/actuation nasal spray,suspension    . folic acid (FOLVITE) 1 MG tablet folic acid 1 mg tablet    . GuaiFENesin (MUCINEX PO) Take 1 tablet by mouth daily.    Marland Kitchen ibandronate (BONIVA) 150 MG tablet ibandronate 150 mg tablet    . levothyroxine (SYNTHROID) 112 MCG  tablet levothyroxine 112 mcg tablet    . omeprazole (PRILOSEC) 20 MG capsule Take 20 mg by mouth daily.    . potassium chloride SA (KLOR-CON) 20 MEQ tablet potassium chloride ER 20 mEq tablet,extended release  take 1 tab per day    . potassium gluconate 595 MG TABS tablet Take 595 mg by mouth daily.    . rivaroxaban (XARELTO) 20 MG TABS tablet Xarelto 20 mg tablet  Take 1 tablet every day by oral route for 30 days.    . rosuvastatin (CRESTOR) 10 MG tablet rosuvastatin 10 mg tablet  TAKE 1 TABLET EVERY DAY BY ORAL ROUTE IN THE EVENING     No current facility-administered medications for this visit.    REVIEW OF SYSTEMS:   10 Point review of Systems was done is negative except as noted above.  PHYSICAL EXAMINATION: ECOG PERFORMANCE STATUS: 1 - Symptomatic but completely ambulatory  . Vitals:   08/25/20 1131  BP: (!) 160/79  Pulse: 72  Resp: 18  Temp: (!) 97.4 F (36.3 C)  SpO2: 100%   Filed Weights   08/25/20 1131  Weight: 126 lb 6.4 oz (57.3 kg)   .Body mass index is 35.55 kg/m.  Exam was given in a chair.   GENERAL:alert, in no acute distress and comfortable SKIN: no acute rashes, no significant lesions EYES: conjunctiva are pink and non-injected, sclera anicteric OROPHARYNX: MMM, no exudates, no oropharyngeal erythema or ulceration NECK: supple, no JVD LYMPH:  no palpable lymphadenopathy in the cervical, axillary or inguinal regions LUNGS: clear to auscultation b/l with normal respiratory effort HEART: regular rate & rhythm ABDOMEN:  normoactive bowel sounds , non tender, not distended. No palpable hepatosplenomegaly.  Extremity: no pedal edema PSYCH: alert & oriented x 3 with fluent speech NEURO: no focal motor/sensory deficits  LABORATORY DATA:  I have reviewed the data as listed  . CBC Latest Ref Rng & Units 08/25/2020 03/27/2020 03/27/2020  WBC 4.0 - 10.5 K/uL 2.8(L) 3.0(L) -  Hemoglobin 12.0 - 15.0 g/dL 13.2 16.5(H) -  Hematocrit 36.0 - 46.0 % 37.4  47.8(H) 48.4(H)  Platelets 150 - 400 K/uL 167 169 -    . CMP Latest Ref Rng &  Units 08/25/2020 03/27/2020 01/30/2020  Glucose 70 - 99 mg/dL 89 95 99  BUN 8 - 23 mg/dL 7(L) 11 11  Creatinine 0.44 - 1.00 mg/dL 0.79 0.89 0.77  Sodium 135 - 145 mmol/L 141 142 140  Potassium 3.5 - 5.1 mmol/L 3.0(L) 3.1(L) 2.9(L)  Chloride 98 - 111 mmol/L 106 106 108  CO2 22 - 32 mmol/L 26 28 21(L)  Calcium 8.9 - 10.3 mg/dL 8.9 9.4 9.0  Total Protein 6.5 - 8.1 g/dL 8.0 8.6(H) -  Total Bilirubin 0.3 - 1.2 mg/dL 0.6 0.7 -  Alkaline Phos 38 - 126 U/L 74 73 -  AST 15 - 41 U/L 17 17 -  ALT 0 - 44 U/L <6 9 -     RADIOGRAPHIC STUDIES: I have personally reviewed the radiological images as listed and agreed with the findings in the report. No results found.  ASSESSMENT & PLAN:   75 yo female with achondroplasia with   1) Extensive Left lower extremity DVT ? May thurner syndrome   PLAN: -Discussed pt labwork today, 08/25/2020; potassium lower, counts stable. -Advised pt that she should wait minimum 1-1.5 hours prior to eating or taking other medications after taking her Synthroid. -Recommend complete smoking cessation as it increases the risk of repeat clots. -Continue Xarelto 20mg  po daily -Recommended pt receive the second COVID booster shot as recently approved. Advised pt to wait 4-6 months following first booster shot before getting this. -Recommended pt f/u w PCP regarding potassium dosage due to lower levels. Advised pt to take it BID (40 meq total daily) for the next week until she is able to get in contact. -labs today show negative lupus anticoagulant but her anticardiolipin IgM antibody is persistently positive in the low to mid range.  This could potentially be a risk factor for recurrent clots would lead to the situation of continued long-term anticoagulation with Xarelto. -Will see back in 6 months with labs.  At that time we will discuss if we can reduce to the preventive dose of Xarelto  provided she has quit smoking completely.    FOLLOW UP: RTC with Dr Irene Limbo in 6 months with labs   The total time spent in the appt was 20 minutes and more than 50% was on counseling and direct patient cares.  All of the patient's questions were answered with apparent satisfaction. The patient knows to call the clinic with any problems, questions or concerns.    Sullivan Lone MD [place] AAHIVMS [facility] Hematology/Oncology Physician [facility]  (Office):       [phone] (Work cell):  [phone] (Fax):           [phone]  08/25/2020 12:22 PM  I, Reinaldo Raddle, am acting as scribe for Dr. Sullivan Lone, MD.    .I have reviewed the above documentation for accuracy and completeness, and I agree with the above. Brunetta Genera MD

## 2020-08-25 ENCOUNTER — Other Ambulatory Visit: Payer: Medicare HMO

## 2020-08-25 ENCOUNTER — Inpatient Hospital Stay: Payer: Medicare HMO | Admitting: Hematology

## 2020-08-25 ENCOUNTER — Other Ambulatory Visit: Payer: Self-pay

## 2020-08-25 ENCOUNTER — Inpatient Hospital Stay: Payer: Medicare HMO | Attending: Hematology

## 2020-08-25 ENCOUNTER — Ambulatory Visit: Payer: Medicare HMO | Admitting: Hematology

## 2020-08-25 VITALS — BP 160/79 | HR 72 | Temp 97.4°F | Resp 18 | Ht <= 58 in | Wt 126.4 lb

## 2020-08-25 DIAGNOSIS — E039 Hypothyroidism, unspecified: Secondary | ICD-10-CM | POA: Insufficient documentation

## 2020-08-25 DIAGNOSIS — Z79899 Other long term (current) drug therapy: Secondary | ICD-10-CM | POA: Insufficient documentation

## 2020-08-25 DIAGNOSIS — Z86718 Personal history of other venous thrombosis and embolism: Secondary | ICD-10-CM | POA: Diagnosis not present

## 2020-08-25 DIAGNOSIS — Q774 Achondroplasia: Secondary | ICD-10-CM | POA: Insufficient documentation

## 2020-08-25 DIAGNOSIS — I82422 Acute embolism and thrombosis of left iliac vein: Secondary | ICD-10-CM

## 2020-08-25 DIAGNOSIS — Z7901 Long term (current) use of anticoagulants: Secondary | ICD-10-CM | POA: Insufficient documentation

## 2020-08-25 DIAGNOSIS — R760 Raised antibody titer: Secondary | ICD-10-CM | POA: Diagnosis not present

## 2020-08-25 DIAGNOSIS — I10 Essential (primary) hypertension: Secondary | ICD-10-CM | POA: Diagnosis not present

## 2020-08-25 DIAGNOSIS — F17210 Nicotine dependence, cigarettes, uncomplicated: Secondary | ICD-10-CM | POA: Diagnosis not present

## 2020-08-25 LAB — CBC WITH DIFFERENTIAL/PLATELET
Abs Immature Granulocytes: 0 10*3/uL (ref 0.00–0.07)
Basophils Absolute: 0 10*3/uL (ref 0.0–0.1)
Basophils Relative: 1 %
Eosinophils Absolute: 0.1 10*3/uL (ref 0.0–0.5)
Eosinophils Relative: 3 %
HCT: 37.4 % (ref 36.0–46.0)
Hemoglobin: 13.2 g/dL (ref 12.0–15.0)
Immature Granulocytes: 0 %
Lymphocytes Relative: 40 %
Lymphs Abs: 1.1 10*3/uL (ref 0.7–4.0)
MCH: 37.7 pg — ABNORMAL HIGH (ref 26.0–34.0)
MCHC: 35.3 g/dL (ref 30.0–36.0)
MCV: 106.9 fL — ABNORMAL HIGH (ref 80.0–100.0)
Monocytes Absolute: 0.3 10*3/uL (ref 0.1–1.0)
Monocytes Relative: 10 %
Neutro Abs: 1.3 10*3/uL — ABNORMAL LOW (ref 1.7–7.7)
Neutrophils Relative %: 46 %
Platelets: 167 10*3/uL (ref 150–400)
RBC: 3.5 MIL/uL — ABNORMAL LOW (ref 3.87–5.11)
RDW: 15.9 % — ABNORMAL HIGH (ref 11.5–15.5)
WBC: 2.8 10*3/uL — ABNORMAL LOW (ref 4.0–10.5)
nRBC: 0.7 % — ABNORMAL HIGH (ref 0.0–0.2)

## 2020-08-25 LAB — CMP (CANCER CENTER ONLY)
ALT: <6 U/L (ref 0–44)
AST: 17 U/L (ref 15–41)
Albumin: 3.5 g/dL (ref 3.5–5.0)
Alkaline Phosphatase: 74 U/L (ref 38–126)
Anion gap: 9 (ref 5–15)
BUN: 7 mg/dL — ABNORMAL LOW (ref 8–23)
CO2: 26 mmol/L (ref 22–32)
Calcium: 8.9 mg/dL (ref 8.9–10.3)
Chloride: 106 mmol/L (ref 98–111)
Creatinine: 0.79 mg/dL (ref 0.44–1.00)
GFR, Estimated: >60 mL/min (ref >60)
Glucose, Bld: 89 mg/dL (ref 70–99)
Potassium: 3 mmol/L — ABNORMAL LOW (ref 3.5–5.1)
Sodium: 141 mmol/L (ref 135–145)
Total Bilirubin: 0.6 mg/dL (ref 0.3–1.2)
Total Protein: 8 g/dL (ref 6.5–8.1)

## 2020-08-26 LAB — CARDIOLIPIN ANTIBODIES, IGG, IGM, IGA
Anticardiolipin IgA: <9 APL U/mL (ref 0–11)
Anticardiolipin IgG: <9 GPL U/mL (ref 0–14)
Anticardiolipin IgM: 25 MPL U/mL — ABNORMAL HIGH (ref 0–12)

## 2020-08-26 LAB — LUPUS ANTICOAGULANT PANEL
DRVVT: 40.6 s (ref 0.0–47.0)
PTT Lupus Anticoagulant: 34.6 s (ref 0.0–51.9)

## 2020-08-28 ENCOUNTER — Telehealth: Payer: Self-pay | Admitting: Hematology

## 2020-08-28 NOTE — Telephone Encounter (Signed)
Scheduled follow-up appointment per 5/13 los. Patient is aware.

## 2021-02-01 ENCOUNTER — Encounter: Payer: Self-pay | Admitting: Physician Assistant

## 2021-02-21 ENCOUNTER — Encounter: Payer: Self-pay | Admitting: Physician Assistant

## 2021-02-21 ENCOUNTER — Ambulatory Visit: Payer: Medicare HMO | Admitting: Physician Assistant

## 2021-02-21 VITALS — BP 166/74 | HR 72 | Ht <= 58 in | Wt 124.2 lb

## 2021-02-21 DIAGNOSIS — Z86010 Personal history of colonic polyps: Secondary | ICD-10-CM | POA: Diagnosis not present

## 2021-02-21 DIAGNOSIS — Z7901 Long term (current) use of anticoagulants: Secondary | ICD-10-CM | POA: Diagnosis not present

## 2021-02-21 DIAGNOSIS — I82402 Acute embolism and thrombosis of unspecified deep veins of left lower extremity: Secondary | ICD-10-CM

## 2021-02-21 NOTE — Patient Instructions (Signed)
You have been scheduled for a colonoscopy. Please follow written instructions given to you at your visit today.  Please pick up your prep supplies at the pharmacy within the next 1-3 days. If you use inhalers (even only as needed), please bring them with you on the day of your procedure.  If you are age 73 or older, your body mass index should be between 23-30. Your Body mass index is 34.4 kg/m. If this is out of the aforementioned range listed, please consider follow up with your Primary Care Provider.  If you are age 77 or younger, your body mass index should be between 19-25. Your Body mass index is 34.4 kg/m. If this is out of the aformentioned range listed, please consider follow up with your Primary Care Provider.   ________________________________________________________  The [facility] providers would like to encourage you to use [facility] to communicate with providers for non-urgent requests or questions.  Due to long hold times on the telephone, sending your provider a message by [facility] may be a faster and more efficient way to get a response.  Please allow 48 business hours for a response.  Please remember that this is for non-urgent requests.  _______________________________________________________

## 2021-02-21 NOTE — Progress Notes (Signed)
Reviewed and agree with management plan.  Anthonella Klausner T. Massa Pe, MD FACG 

## 2021-02-21 NOTE — Progress Notes (Signed)
Chief Complaint: Consultation for surveillance colonoscopy in a patient on chronic anticoagulation  HPI:    Kaitlyn Barnes is a 75 year old female with a past medical history of DVT in October 2021 on Xarelto, GERD and others listed below, known to Dr. Fuller Plan who was referred to me by Audley Hose, MD for consideration of a surveillance colonoscopy    02/18/2014 colonoscopy with a sessile polyp in the ascending colon and mild diverticulosis in the sigmoid colon.  Pathology showed adenomatous polyp.  Repeat recommended in 5 years.    08/25/2020 patient followed with hematology for her DVT.  It was discussed she had extensive left lower extremity DVT and was continued on Xarelto 20 mg p.o. daily.  Told to follow-up with them in 6 months.    Today, the patient presents to clinic and explains that she is ready to have her colonoscopy.  Relays history of recent diagnosis of DVT in October of last year and a follow-up with her oncologist coming up next week.  Tells me currently she is on Xarelto once daily.  She has no GI complaints or concerns.    Denies fever, chills, weight loss, change in bowel habits, blood in her stool, heartburn, reflux or abdominal pain  Past Medical History:  Diagnosis Date   Allergy    Arthritis    Complication of anesthesia    DVT (deep venous thrombosis) ([facility])    Dysrhythmia    irregular   GERD (gastroesophageal reflux disease)    Hypertension    Hypothyroidism    PONV (postoperative nausea and vomiting)    nausea after C-Section   Seasonal allergies     Past Surgical History:  Procedure Laterality Date   ABDOMINAL HYSTERECTOMY     BACK SURGERY  10/21/2012   Dr.Betero   CESAREAN SECTION     DENTAL SURGERY     removed    Current Outpatient Medications  Medication Sig Dispense Refill   alendronate (FOSAMAX) 70 MG tablet alendronate 70 mg tablet (Patient not taking: Reported on 05/22/2020)     amLODipine-benazepril (LOTREL) 10-20 MG per capsule Take 1  capsule by mouth daily.     cetirizine (ZYRTEC) 10 MG tablet Take 10 mg by mouth daily.     Cholecalciferol 1.25 MG (50000 UT) capsule cholecalciferol (vitamin D3) 1,250 mcg (50,000 unit) capsule  TAKE 1 CAPSULE EVERY WEEK BY ORAL ROUTE.     cyanocobalamin 1000 MCG tablet cyanocobalamin (vit B-12) 1,000 mcg tablet  Take 1 tablet every day by oral route.     ergocalciferol (VITAMIN D2) 1.25 MG (50000 UT) capsule ergocalciferol (vitamin D2) 1,250 mcg (50,000 unit) capsule  TAKE 1 CAPSULE BY MOUTH ONE TIME PER WEEK     fluticasone (FLONASE) 50 MCG/ACT nasal spray fluticasone propionate 50 mcg/actuation nasal spray,suspension     folic acid (FOLVITE) 1 MG tablet folic acid 1 mg tablet     GuaiFENesin (MUCINEX PO) Take 1 tablet by mouth daily.     ibandronate (BONIVA) 150 MG tablet ibandronate 150 mg tablet     levothyroxine (SYNTHROID) 112 MCG tablet levothyroxine 112 mcg tablet     omeprazole (PRILOSEC) 20 MG capsule Take 20 mg by mouth daily.     potassium chloride SA (KLOR-CON) 20 MEQ tablet potassium chloride ER 20 mEq tablet,extended release  take 1 tab per day     potassium gluconate 595 MG TABS tablet Take 595 mg by mouth daily.     rivaroxaban (XARELTO) 20 MG TABS tablet Xarelto 20 mg  tablet  Take 1 tablet every day by oral route for 30 days.     rosuvastatin (CRESTOR) 10 MG tablet rosuvastatin 10 mg tablet  TAKE 1 TABLET EVERY DAY BY ORAL ROUTE IN THE EVENING     No current facility-administered medications for this visit.    Allergies as of 02/21/2021 - Review Complete 05/22/2020  Allergen Reaction Noted   Other Hives and Cough 10/15/2012   Dog epithelium allergy skin test  02/08/2020   Sulfa antibiotics  02/08/2020    Family History  Problem Relation Age of Onset   Colon cancer Neg Hx     Social History   Socioeconomic History   Marital status: Widowed    Spouse name: Not on file   Number of children: Not on file   Years of education: Not on file   Highest education  level: Not on file  Occupational History   Not on file  Tobacco Use   Smoking status: Every Day    Packs/day: 0.25    Years: 20.00    Pack years: 5.00    Types: Cigarettes   Smokeless tobacco: Never  Vaping Use   Vaping Use: Never used  Substance and Sexual Activity   Alcohol use: Yes    Alcohol/week: 3.0 standard drinks    Types: 3 Glasses of wine per week   Drug use: No   Sexual activity: Not on file  Other Topics Concern   Not on file  Social History Narrative   Not on file   Social Determinants of Health   Financial Resource Strain: Not on file  Food Insecurity: Not on file  Transportation Needs: Not on file  Physical Activity: Not on file  Stress: Not on file  Social Connections: Not on file  Intimate Partner Violence: Not on file    Review of Systems:    Constitutional: No weight loss, fever or chills Skin: No rash  Cardiovascular: No chest pain   Respiratory: No SOB  Gastrointestinal: See HPI and otherwise negative Genitourinary: No dysuria  Neurological: No headache, dizziness or syncope Musculoskeletal: No new muscle or joint pain Hematologic: No bleeding Psychiatric: No history of depression or anxiety   Physical Exam:  Vital signs: BP (!) 166/74 (BP Location: Left Arm, Patient Position: Sitting, Cuff Size: Normal)   Pulse 72   Ht 4' 2.39" (1.28 m)   Wt 124 lb 4 oz (56.4 kg)   BMI 34.40 kg/m    Constitutional:   Pleasant AA female appears to be in NAD, Well developed, Well nourished, alert and cooperative Respiratory: Respirations even and unlabored. Lungs clear to auscultation bilaterally.   No wheezes, crackles, or rhonchi.  Cardiovascular: Normal S1, S2. No MRG. Regular rate and rhythm. No peripheral edema, cyanosis or pallor.  Gastrointestinal:  Soft, nondistended, nontender. No rebound or guarding. Normal bowel sounds. No appreciable masses or hepatomegaly. Rectal:  Not performed. Marland Kitchen Psychiatric: Oriented to person, place and time.  Demonstrates good judgement and reason without abnormal affect or behaviors.  RELEVANT LABS AND IMAGING: CBC    Component Value Date/Time   WBC 2.8 (L) 08/25/2020 1000   RBC 3.50 (L) 08/25/2020 1000   HGB 13.2 08/25/2020 1000   HCT 37.4 08/25/2020 1000   HCT 47.8 (H) 03/27/2020 1242   PLT 167 08/25/2020 1000   MCV 106.9 (H) 08/25/2020 1000   MCH 37.7 (H) 08/25/2020 1000   MCHC 35.3 08/25/2020 1000   RDW 15.9 (H) 08/25/2020 1000   LYMPHSABS 1.1 08/25/2020 1000  MONOABS 0.3 08/25/2020 1000   EOSABS 0.1 08/25/2020 1000   BASOSABS 0.0 08/25/2020 1000    CMP     Component Value Date/Time   NA 141 08/25/2020 1000   K 3.0 (L) 08/25/2020 1000   CL 106 08/25/2020 1000   CO2 26 08/25/2020 1000   GLUCOSE 89 08/25/2020 1000   BUN 7 (L) 08/25/2020 1000   CREATININE 0.79 08/25/2020 1000   CALCIUM 8.9 08/25/2020 1000   PROT 8.0 08/25/2020 1000   ALBUMIN 3.5 08/25/2020 1000   AST 17 08/25/2020 1000   ALT <6 08/25/2020 1000   ALKPHOS 74 08/25/2020 1000   BILITOT 0.6 08/25/2020 1000   GFRNONAA >60 08/25/2020 1000   GFRAA 83 (L) 10/15/2012 0907    Assessment: 1.  History of adenomatous polyps: Last colonoscopy in 2015 with recommendations to repeat in 5 years 2.  Chronic anticoagulation for DVT: On Xarelto  Plan: 1.  Patient was scheduled for surveillance colonoscopy in the [facility] with Dr. Fuller Plan.  Did provide the patient with a detailed list of risks for with the procedure and she agrees to proceed. Patient is appropriate for endoscopic procedure(s) in the ambulatory ([facility]) setting.  2.  Patient was advised to hold her Xarelto for 2 days prior to time of procedure.  We will communicate with her prescribing physician Dr. Irene Limbo to ensure that holding her Xarelto is acceptable for her. 3.  Patient has an upcoming appointment with oncology for follow-up of her DVT November 14, will wait to schedule colonoscopy after this time to ensure anticoagulation clearance. 4.  Patient to follow in  clinic per recommendations from Dr. Fuller Plan after time of procedure.  Ellouise Newer, PA-C [facility] 02/21/2021, 9:30 AM  Cc: Audley Hose, MD

## 2021-02-23 ENCOUNTER — Other Ambulatory Visit: Payer: Self-pay

## 2021-02-23 ENCOUNTER — Telehealth: Payer: Self-pay | Admitting: *Deleted

## 2021-02-23 DIAGNOSIS — R760 Raised antibody titer: Secondary | ICD-10-CM

## 2021-02-23 DIAGNOSIS — I82422 Acute embolism and thrombosis of left iliac vein: Secondary | ICD-10-CM

## 2021-02-23 NOTE — Telephone Encounter (Signed)
   Kaitlyn Barnes Sep 07, 1945 [id number]  Dear Dr. Irene Limbo:  We have scheduled the above named patient for a(n) colonoscopy procedure. Our records show that (s)he is on anticoagulation therapy.  Please advise as to whether the patient may come off their therapy of Xarelto 2 days prior to their procedure which is scheduled for Wednesday 04/18/21.  Please route your response to Caryl Asp, [place] or fax response to [phone].  Sincerely,   Caryl Asp, [facility]

## 2021-02-26 ENCOUNTER — Inpatient Hospital Stay: Payer: Medicare HMO

## 2021-02-26 ENCOUNTER — Inpatient Hospital Stay: Payer: Medicare HMO | Attending: Hematology | Admitting: Hematology

## 2021-02-26 NOTE — Progress Notes (Signed)
Pt did not show for appointments, message sent to scheduling to contact pt to reschedule.

## 2021-02-27 ENCOUNTER — Telehealth: Payer: Self-pay | Admitting: Hematology

## 2021-02-27 NOTE — Telephone Encounter (Signed)
Scheduled per sch msg. Called, not able to leave msg. Mailed pirntout

## 2021-03-02 ENCOUNTER — Other Ambulatory Visit: Payer: Self-pay | Admitting: Internal Medicine

## 2021-03-02 DIAGNOSIS — Z1231 Encounter for screening mammogram for malignant neoplasm of breast: Secondary | ICD-10-CM

## 2021-03-19 ENCOUNTER — Ambulatory Visit: Payer: Medicare HMO | Admitting: Hematology

## 2021-03-19 ENCOUNTER — Other Ambulatory Visit: Payer: Self-pay

## 2021-03-19 ENCOUNTER — Other Ambulatory Visit: Payer: Medicare HMO

## 2021-03-19 ENCOUNTER — Inpatient Hospital Stay: Payer: Medicare HMO | Attending: Hematology

## 2021-03-19 ENCOUNTER — Inpatient Hospital Stay: Payer: Medicare HMO | Admitting: Hematology

## 2021-03-19 VITALS — BP 143/77 | HR 86 | Temp 97.7°F | Resp 20 | Wt 125.6 lb

## 2021-03-19 DIAGNOSIS — Q774 Achondroplasia: Secondary | ICD-10-CM | POA: Diagnosis not present

## 2021-03-19 DIAGNOSIS — Z90710 Acquired absence of both cervix and uterus: Secondary | ICD-10-CM | POA: Insufficient documentation

## 2021-03-19 DIAGNOSIS — I10 Essential (primary) hypertension: Secondary | ICD-10-CM | POA: Insufficient documentation

## 2021-03-19 DIAGNOSIS — F17210 Nicotine dependence, cigarettes, uncomplicated: Secondary | ICD-10-CM | POA: Diagnosis not present

## 2021-03-19 DIAGNOSIS — R760 Raised antibody titer: Secondary | ICD-10-CM

## 2021-03-19 DIAGNOSIS — Z7901 Long term (current) use of anticoagulants: Secondary | ICD-10-CM | POA: Diagnosis not present

## 2021-03-19 DIAGNOSIS — I82422 Acute embolism and thrombosis of left iliac vein: Secondary | ICD-10-CM | POA: Diagnosis not present

## 2021-03-19 DIAGNOSIS — I82412 Acute embolism and thrombosis of left femoral vein: Secondary | ICD-10-CM | POA: Insufficient documentation

## 2021-03-19 LAB — CMP (CANCER CENTER ONLY)
ALT: 18 U/L (ref 0–44)
AST: 29 U/L (ref 15–41)
Albumin: 3.7 g/dL (ref 3.5–5.0)
Alkaline Phosphatase: 72 U/L (ref 38–126)
Anion gap: 11 (ref 5–15)
BUN: 13 mg/dL (ref 8–23)
CO2: 21 mmol/L — ABNORMAL LOW (ref 22–32)
Calcium: 9.3 mg/dL (ref 8.9–10.3)
Chloride: 110 mmol/L (ref 98–111)
Creatinine: 0.84 mg/dL (ref 0.44–1.00)
GFR, Estimated: >60 mL/min (ref >60)
Glucose, Bld: 91 mg/dL (ref 70–99)
Potassium: 3.8 mmol/L (ref 3.5–5.1)
Sodium: 142 mmol/L (ref 135–145)
Total Bilirubin: 0.5 mg/dL (ref 0.3–1.2)
Total Protein: 8.5 g/dL — ABNORMAL HIGH (ref 6.5–8.1)

## 2021-03-19 LAB — CBC WITH DIFFERENTIAL (CANCER CENTER ONLY)
Abs Immature Granulocytes: 0.01 10*3/uL (ref 0.00–0.07)
Basophils Absolute: 0 10*3/uL (ref 0.0–0.1)
Basophils Relative: 1 %
Eosinophils Absolute: 0.3 10*3/uL (ref 0.0–0.5)
Eosinophils Relative: 7 %
HCT: 37.1 % (ref 36.0–46.0)
Hemoglobin: 13.1 g/dL (ref 12.0–15.0)
Immature Granulocytes: 0 %
Lymphocytes Relative: 34 %
Lymphs Abs: 1.2 10*3/uL (ref 0.7–4.0)
MCH: 38.2 pg — ABNORMAL HIGH (ref 26.0–34.0)
MCHC: 35.3 g/dL (ref 30.0–36.0)
MCV: 108.2 fL — ABNORMAL HIGH (ref 80.0–100.0)
Monocytes Absolute: 0.4 10*3/uL (ref 0.1–1.0)
Monocytes Relative: 12 %
Neutro Abs: 1.6 10*3/uL — ABNORMAL LOW (ref 1.7–7.7)
Neutrophils Relative %: 46 %
Platelet Count: 108 10*3/uL — ABNORMAL LOW (ref 150–400)
RBC: 3.43 MIL/uL — ABNORMAL LOW (ref 3.87–5.11)
RDW: 14.6 % (ref 11.5–15.5)
WBC Count: 3.5 10*3/uL — ABNORMAL LOW (ref 4.0–10.5)
nRBC: 0 % (ref 0.0–0.2)

## 2021-03-19 NOTE — Progress Notes (Signed)
HEMATOLOGY/ONCOLOGY CLINIC NOTE  Date of Service: 03/19/2021  Patient Care Team: Audley Hose, MD as PCP - General (Internal Medicine)  CHIEF COMPLAINTS/PURPOSE OF CONSULTATION:  Follow-up for anticoagulation recommendations for DVT Follow-up for positive anticardiolipin IgM antibody   HISTORY OF PRESENTING ILLNESS:    Kaitlyn Barnes is a wonderful 75 y.o. female who has been referred to [place] by Dr. Maia Petties for evaluation and management of DVT. The pt reports that she is doing well overall.   The pt reports that she had no blood clots prior to her event in October. She has no family history of blood clots, blood disorders, or cancers. Pt began to exercise and lost over 15 lbs over the last year. During one of her exercise sessions the pt felt a sharp pain in her left thigh. The discomfort was so bothersome that she struggled to walk. After this occurred she stretched, took two Tylenol, and laid down. When the patient awoke she noticed that her entire left leg, from her groid to her ankle, was swollen. Two days later she went to the ED where they completed an ultrasound of her left leg. Pt denies any vaccinations, injury to the area, long-distance travel, or surgery directly before her clot. Pt was started on Actonel in the months before her clot, but asked to stop due to the medication causing fevers, chills, nausea, and vomiting. She held the medication about three weeks prior to the clotting event. Pt was smoking at the time of her blood clot.  She still has some left leg swelling and has been using compression socks. She feels that her leg swelling is better by at least 50%. Pt has also continued smoking.   Pt has a history of achondroplasia and hypothyroidism. Pt is unsure why she has chronic hypokalemia or thrombocytopenia. She has continued Folic acid and Vitamin B12 replacement since her hospital visit. Pt is up to date with age appropriate cancer screenings. Pt has been  struggling emotionally with the passing of her mother last year, as they were really close.  Of note prior to the patient's visit today, pt has had [place] Lower Extremity Venous Left completed on 01/30/2020 with results revealing "RIGHT: - No evidence of common femoral or external iliac vein obstruction. LEFT: - Findings consistent with acute deep vein thrombosis involving the left common femoral vein, SF junction, left femoral vein, left popliteal vein, left posterior tibial veins, left peroneal veins, left soleal veins, left gastrocnemius veins, and EIV, mid to distal CIV."  Most recent lab results (02/17/2020) of CBC w/diff & CMP is as follows: all values are WNL except for WBC at 3.4K, MCV at 106.1, MCH at 36.6, Potassium at 3.2, Globulin at 3.8. 02/17/2020 Homocysteine at 33.7 02/17/2020 Folate at 2.8  On review of systems, pt reports left left swelling and denies unexpected weight loss, abnormal vaginal discharge, abdominal pain, urinary habit changes, bowel habit changes, fevers, chills, night sweats, chest pain SOB and any other symptoms.   On PMHx the pt reports Achondroplasia, Hypothyroidism, HTN. On Social Hx the pt reports that she smokes 3-5 cigarettes per day. She denies any heavier smoking history previously.  INTERVAL HISTORY:  Kaitlyn Barnes is here for follow-up regarding recommendations for her history of extensive left-sided DVT.  She notes that her left lower extremity swelling and pain have remained completely resolved. Leg pain or swelling.  No new chest pain or shortness of breath. Patient is tolerating her Xarelto without any issues of bleeding.  She is still smoking about 1 pack every 4 days of cigarettes and has been trying to cut down. Labs today CBC/diff stable hemoglobin of 13.1, WBC count 3.5k platelets of 108k CMP unremarkable Anticardiolipin antibody testing from today pending   MEDICAL HISTORY:  Past Medical History:  Diagnosis Date   Allergy     Arthritis    Complication of anesthesia    DVT (deep venous thrombosis) (HCC)    Dysrhythmia    irregular   GERD (gastroesophageal reflux disease)    HLD (hyperlipidemia)    Hypertension    Hypothyroidism    Low serum potassium    PONV (postoperative nausea and vomiting)    nausea after C-Section   Seasonal allergies     SURGICAL HISTORY: Past Surgical History:  Procedure Laterality Date   ABDOMINAL HYSTERECTOMY     BACK SURGERY  10/21/2012   Dr.Betero   CESAREAN SECTION     DENTAL SURGERY     removed    SOCIAL HISTORY: Social History   Socioeconomic History   Marital status: Widowed    Spouse name: Not on file   Number of children: 2   Years of education: Not on file   Highest education level: Not on file  Occupational History   Occupation: retired  Tobacco Use   Smoking status: Every Day    Packs/day: 0.25    Years: 20.00    Pack years: 5.00    Types: Cigarettes   Smokeless tobacco: Never  Vaping Use   Vaping Use: Never used  Substance and Sexual Activity   Alcohol use: Yes    Alcohol/week: 3.0 standard drinks    Types: 3 Glasses of wine per week    Comment: weekends   Drug use: No   Sexual activity: Not on file  Other Topics Concern   Not on file  Social History Narrative   Not on file   Social Determinants of Health   Financial Resource Strain: Not on file  Food Insecurity: Not on file  Transportation Needs: Not on file  Physical Activity: Not on file  Stress: Not on file  Social Connections: Not on file  Intimate Partner Violence: Not on file    FAMILY HISTORY: Family History  Problem Relation Age of Onset   Diabetes Mother    Pancreatic disease Mother    Hypertension Mother    Kidney disease Mother    Diabetes Father    Hypertension Father    Hypertension Maternal Grandmother    Diabetes Daughter    Pancreatic disease Daughter    Kidney disease Daughter    Colon cancer Neg Hx     ALLERGIES:  is allergic to other, dog epithelium  allergy skin test, and sulfa antibiotics.  MEDICATIONS:  Current Outpatient Medications  Medication Sig Dispense Refill   alendronate (FOSAMAX) 70 MG tablet      amLODipine-benazepril (LOTREL) 10-20 MG per capsule Take 1 capsule by mouth daily.     cetirizine (ZYRTEC) 10 MG tablet Take 10 mg by mouth daily.     Cholecalciferol 1.25 MG (50000 UT) capsule cholecalciferol (vitamin D3) 1,250 mcg (50,000 unit) capsule  TAKE 1 CAPSULE EVERY WEEK BY ORAL ROUTE.     cyanocobalamin 1000 MCG tablet cyanocobalamin (vit B-12) 1,000 mcg tablet  Take 1 tablet every day by oral route.     ergocalciferol (VITAMIN D2) 1.25 MG (50000 UT) capsule ergocalciferol (vitamin D2) 1,250 mcg (50,000 unit) capsule  TAKE 1 CAPSULE BY MOUTH ONE TIME PER WEEK  fluticasone (FLONASE) 50 MCG/ACT nasal spray fluticasone propionate 50 mcg/actuation nasal spray,suspension     folic acid (FOLVITE) 1 MG tablet folic acid 1 mg tablet     GuaiFENesin (MUCINEX PO) Take 1 tablet by mouth daily.     levothyroxine (SYNTHROID) 150 MCG tablet Take 150 mcg by mouth daily.     omeprazole (PRILOSEC) 20 MG capsule Take 20 mg by mouth daily.     potassium chloride SA (KLOR-CON) 20 MEQ tablet potassium chloride ER 20 mEq tablet,extended release  take 1 tab per day     potassium gluconate 595 MG TABS tablet Take 595 mg by mouth daily.     rivaroxaban (XARELTO) 20 MG TABS tablet Xarelto 20 mg tablet  Take 1 tablet every day by oral route for 30 days.     rosuvastatin (CRESTOR) 10 MG tablet rosuvastatin 10 mg tablet  TAKE 1 TABLET EVERY DAY BY ORAL ROUTE IN THE EVENING     No current facility-administered medications for this visit.    REVIEW OF SYSTEMS:   .10 Point review of Systems was done is negative except as noted above.   PHYSICAL EXAMINATION: ECOG PERFORMANCE STATUS: 1 - Symptomatic but completely ambulatory  . Vitals:   03/19/21 1039  BP: (!) 143/77  Pulse: 86  Resp: 20  Temp: 97.7 F (36.5 C)  SpO2: 100%   Filed  Weights   03/19/21 1039  Weight: 125 lb 9.6 oz (57 kg)   .Body mass index is 34.77 kg/m.  Exam was given in a chair.  Marland Kitchen GENERAL:alert, in no acute distress and comfortable SKIN: no acute rashes, no significant lesions EYES: conjunctiva are pink and non-injected, sclera anicteric OROPHARYNX: MMM, no exudates, no oropharyngeal erythema or ulceration NECK: supple, no JVD LYMPH:  no palpable lymphadenopathy in the cervical, axillary or inguinal regions LUNGS: clear to auscultation b/l with normal respiratory effort HEART: regular rate & rhythm ABDOMEN:  normoactive bowel sounds , non tender, not distended. Extremity: no pedal edema PSYCH: alert & oriented x 3 with fluent speech NEURO: no focal motor/sensory deficits   LABORATORY DATA:  I have reviewed the data as listed  . CBC Latest Ref Rng & Units 03/19/2021 08/25/2020 03/27/2020  WBC 4.0 - 10.5 K/uL 3.5(L) 2.8(L) 3.0(L)  Hemoglobin 12.0 - 15.0 g/dL 13.1 13.2 16.5(H)  Hematocrit 36.0 - 46.0 % 37.1 37.4 47.8(H)  Platelets 150 - 400 K/uL 108(L) 167 169    . CMP Latest Ref Rng & Units 03/19/2021 08/25/2020 03/27/2020  Glucose 70 - 99 mg/dL 91 89 95  BUN 8 - 23 mg/dL 13 7(L) 11  Creatinine 0.44 - 1.00 mg/dL 0.84 0.79 0.89  Sodium 135 - 145 mmol/L 142 141 142  Potassium 3.5 - 5.1 mmol/L 3.8 3.0(L) 3.1(L)  Chloride 98 - 111 mmol/L 110 106 106  CO2 22 - 32 mmol/L 21(L) 26 28  Calcium 8.9 - 10.3 mg/dL 9.3 8.9 9.4  Total Protein 6.5 - 8.1 g/dL 8.5(H) 8.0 8.6(H)  Total Bilirubin 0.3 - 1.2 mg/dL 0.5 0.6 0.7  Alkaline Phos 38 - 126 U/L 72 74 73  AST 15 - 41 U/L 29 17 17   ALT 0 - 44 U/L 18 <6 9   Anticardiolipin antibody testing from today pending   RADIOGRAPHIC STUDIES: I have personally reviewed the radiological images as listed and agreed with the findings in the report. No results found.  ASSESSMENT & PLAN:   75 yo female with achondroplasia with   1) Extensive Left lower extremity DVT  May Thurner syndrome was a  consideration but CT venogram from 04/20/2020 showed no evidence of pelvic or caval DVT narrowing or thrombosis Patient is cardiolipin IgM positive -repeat labs from today pending. Active smoking was a risk factor and continues to be one as well. Previous thrombophilia work-up was otherwise unrevealing. PLAN: -Patient has completed anticoagulation -At this time she has no symptoms of DVT with complete resolution of left lower extremity pain and swelling. -Continue Xarelto 20 mg p.o. daily -I discussed with the patient that we shall follow-up on her cardiolipin antibody if this is negative and she is able to completely quit smoking we could reduce her Xarelto to 10 mg p.o. daily for DVT prophylaxis versus switching to baby aspirin. -She should continue follow-up with primary care physician at this time for continued monitoring and Xarelto refills. -Was again extensively counseled on smoking cessation.  She notes that she has cut back to 1 pack every 4 days.   FOLLOW UP: Return to clinic with PCP  All of the patient's questions were answered with apparent satisfaction. The patient knows to call the clinic with any problems, questions or concerns.    Sullivan Lone MD [place] AAHIVMS [facility] Hematology/Oncology Physician [facility]

## 2021-03-21 LAB — CARDIOLIPIN ANTIBODIES, IGG, IGM, IGA
Anticardiolipin IgA: <9 APL U/mL (ref 0–11)
Anticardiolipin IgG: <9 GPL U/mL (ref 0–14)
Anticardiolipin IgM: 28 MPL U/mL — ABNORMAL HIGH (ref 0–12)

## 2021-04-06 NOTE — Telephone Encounter (Signed)
Please advise 

## 2021-04-10 NOTE — Telephone Encounter (Signed)
Left message for Dr. Irene Limbo office to call back.

## 2021-04-10 NOTE — Telephone Encounter (Signed)
OK to hold Xarelto x 48 hours prior to colonoscopy.

## 2021-04-10 NOTE — Telephone Encounter (Signed)
Called patient. No answer, no voicemail.

## 2021-04-11 NOTE — Telephone Encounter (Signed)
Kaitlyn Barnes called back , her power has been out. She gave me a cell # today for her. She verbalized understanding to hold her xarelto for 2 days prior to her procedure.

## 2021-04-11 NOTE — Telephone Encounter (Signed)
I was unable to reach [facility] by phone so I reached out to [place] who's listed in her chart as okay to speak with and left him a message to please call us this week and ask for PJ or Heather.

## 2021-04-18 ENCOUNTER — Encounter: Payer: Self-pay | Admitting: Gastroenterology

## 2021-04-18 ENCOUNTER — Ambulatory Visit (AMBULATORY_SURGERY_CENTER): Payer: Medicare HMO | Admitting: Gastroenterology

## 2021-04-18 ENCOUNTER — Other Ambulatory Visit: Payer: Self-pay

## 2021-04-18 VITALS — BP 126/51 | HR 68 | Temp 98.6°F | Resp 30 | Ht <= 58 in | Wt 124.0 lb

## 2021-04-18 DIAGNOSIS — D120 Benign neoplasm of cecum: Secondary | ICD-10-CM | POA: Diagnosis not present

## 2021-04-18 DIAGNOSIS — D122 Benign neoplasm of ascending colon: Secondary | ICD-10-CM

## 2021-04-18 DIAGNOSIS — D123 Benign neoplasm of transverse colon: Secondary | ICD-10-CM | POA: Diagnosis not present

## 2021-04-18 DIAGNOSIS — Z86010 Personal history of colonic polyps: Secondary | ICD-10-CM | POA: Diagnosis not present

## 2021-04-18 MED ORDER — SODIUM CHLORIDE 0.9 % IV SOLN: 500.0000 mL | Freq: Once | INTRAVENOUS | Status: AC

## 2021-04-18 NOTE — Progress Notes (Signed)
VS- Kaitlyn Barnes 

## 2021-04-18 NOTE — Progress Notes (Signed)
PT taken to PACU. Monitors in place. VSS. Report given to RN. 

## 2021-04-18 NOTE — Progress Notes (Signed)
History & Physical  Primary Care Physician:  Audley Hose, MD Primary Gastroenterologist: Lucio Edward, MD  CHIEF COMPLAINT:  Personal history of colon polyps   HPI: Kaitlyn Barnes is a 76 y.o. female here personal history of adenomatous colon polyps.  Last colonoscopy in 2015.  She is maintained on Xarelto which was held 2 days prior to the procedure.   Past Medical History:  Diagnosis Date   Allergy    Arthritis    Cataract    Cataract    Complication of anesthesia    DVT (deep venous thrombosis) (HCC)    leg, 2 years ago per pt   Dysrhythmia    irregular   GERD (gastroesophageal reflux disease)    HLD (hyperlipidemia)    Hypertension    Hypothyroidism    Low serum potassium    PONV (postoperative nausea and vomiting)    nausea after C-Section   Seasonal allergies    Sleep apnea    no cpap    Past Surgical History:  Procedure Laterality Date   ABDOMINAL HYSTERECTOMY     BACK SURGERY  10/21/2012   Dr.Betero   CATARACT EXTRACTION Bilateral    CESAREAN SECTION     COLONOSCOPY     DENTAL SURGERY     removed    Prior to Admission medications   Medication Sig Start Date End Date Taking? Authorizing Provider  alendronate (FOSAMAX) 70 MG tablet    Yes [provider]  amLODipine-benazepril (LOTREL) 10-20 MG per capsule Take 1 capsule by mouth daily.   Yes [provider]  cetirizine (ZYRTEC) 10 MG tablet Take 10 mg by mouth daily.   Yes [provider]  cyanocobalamin 1000 MCG tablet cyanocobalamin (vit B-12) 1,000 mcg tablet  Take 1 tablet every day by oral route.   Yes [provider]  ergocalciferol (VITAMIN D2) 1.25 MG (50000 UT) capsule ergocalciferol (vitamin D2) 1,250 mcg (50,000 unit) capsule  TAKE 1 CAPSULE BY MOUTH ONE TIME PER WEEK   Yes [provider]  folic acid (FOLVITE) 1 MG tablet folic acid 1 mg tablet   Yes [provider]  levothyroxine (SYNTHROID) 150 MCG tablet Take 150 mcg  by mouth daily. 02/03/21  Yes [provider]  omeprazole (PRILOSEC) 20 MG capsule Take 20 mg by mouth daily. 07/29/08  Yes [provider]  potassium chloride SA (KLOR-CON) 20 MEQ tablet potassium chloride ER 20 mEq tablet,extended release  take 1 tab per day   Yes [provider]  potassium gluconate 595 MG TABS tablet Take 595 mg by mouth daily.   Yes [provider]  Cholecalciferol 1.25 MG (50000 UT) capsule cholecalciferol (vitamin D3) 1,250 mcg (50,000 unit) capsule  TAKE 1 CAPSULE EVERY WEEK BY ORAL ROUTE.    [provider]  fluticasone (FLONASE) 50 MCG/ACT nasal spray fluticasone propionate 50 mcg/actuation nasal spray,suspension    [provider]  GuaiFENesin (MUCINEX PO) Take 1 tablet by mouth daily.    [provider]  rivaroxaban (XARELTO) 20 MG TABS tablet Xarelto 20 mg tablet  Take 1 tablet every day by oral route for 30 days.    [provider]  rosuvastatin (CRESTOR) 10 MG tablet rosuvastatin 10 mg tablet  TAKE 1 TABLET EVERY DAY BY ORAL ROUTE IN THE EVENING    [provider]    Current Outpatient Medications  Medication Sig Dispense Refill   alendronate (FOSAMAX) 70 MG tablet      amLODipine-benazepril (LOTREL) 10-20 MG per  capsule Take 1 capsule by mouth daily.     cetirizine (ZYRTEC) 10 MG tablet Take 10 mg by mouth daily.     cyanocobalamin 1000 MCG tablet cyanocobalamin (vit B-12) 1,000 mcg tablet  Take 1 tablet every day by oral route.     ergocalciferol (VITAMIN D2) 1.25 MG (50000 UT) capsule ergocalciferol (vitamin D2) 1,250 mcg (50,000 unit) capsule  TAKE 1 CAPSULE BY MOUTH ONE TIME PER WEEK     folic acid (FOLVITE) 1 MG tablet folic acid 1 mg tablet     levothyroxine (SYNTHROID) 150 MCG tablet Take 150 mcg by mouth daily.     omeprazole (PRILOSEC) 20 MG capsule Take 20 mg by mouth daily.     potassium chloride SA (KLOR-CON) 20 MEQ tablet potassium chloride ER 20 mEq tablet,extended  release  take 1 tab per day     potassium gluconate 595 MG TABS tablet Take 595 mg by mouth daily.     Cholecalciferol 1.25 MG (50000 UT) capsule cholecalciferol (vitamin D3) 1,250 mcg (50,000 unit) capsule  TAKE 1 CAPSULE EVERY WEEK BY ORAL ROUTE.     fluticasone (FLONASE) 50 MCG/ACT nasal spray fluticasone propionate 50 mcg/actuation nasal spray,suspension     GuaiFENesin (MUCINEX PO) Take 1 tablet by mouth daily.     rivaroxaban (XARELTO) 20 MG TABS tablet Xarelto 20 mg tablet  Take 1 tablet every day by oral route for 30 days.     rosuvastatin (CRESTOR) 10 MG tablet rosuvastatin 10 mg tablet  TAKE 1 TABLET EVERY DAY BY ORAL ROUTE IN THE EVENING     Current Facility-Administered Medications  Medication Dose Route Frequency Provider Last Rate Last Admin   0.9 %  sodium chloride infusion  500 mL Intravenous Once Ladene Artist, MD        Allergies as of 04/18/2021 - Review Complete 04/18/2021  Allergen Reaction Noted   Other Hives and Cough 10/15/2012   Dog epithelium allergy skin test  02/08/2020   Sulfa antibiotics  02/08/2020    Family History  Problem Relation Age of Onset   Diabetes Mother    Pancreatic disease Mother    Hypertension Mother    Kidney disease Mother    Diabetes Father    Hypertension Father    Hypertension Maternal Grandmother    Diabetes Daughter    Pancreatic disease Daughter    Kidney disease Daughter    Colon cancer Neg Hx    Esophageal cancer Neg Hx    Stomach cancer Neg Hx    Rectal cancer Neg Hx     Social History   Socioeconomic History   Marital status: Widowed    Spouse name: Not on file   Number of children: 2   Years of education: Not on file   Highest education level: Not on file  Occupational History   Occupation: retired  Tobacco Use   Smoking status: Every Day    Packs/day: 0.25    Years: 20.00    Pack years: 5.00    Types: Cigarettes   Smokeless tobacco: Never  Vaping Use   Vaping Use: Never used  Substance and  Sexual Activity   Alcohol use: Yes    Alcohol/week: 3.0 standard drinks    Types: 3 Glasses of wine per week    Comment: weekends   Drug use: No   Sexual activity: Not on file  Other Topics Concern   Not on file  Social History Narrative   Not on file   Social Determinants of  Health   Financial Resource Strain: Not on file  Food Insecurity: Not on file  Transportation Needs: Not on file  Physical Activity: Not on file  Stress: Not on file  Social Connections: Not on file  Intimate Partner Violence: Not on file    Review of Systems:  All systems reviewed an negative except where noted in HPI.  Gen: Denies any fever, chills, sweats, anorexia, fatigue, weakness, malaise, weight loss, and sleep disorder CV: Denies chest pain, angina, palpitations, syncope, orthopnea, PND, peripheral edema, and claudication. Resp: Denies dyspnea at rest, dyspnea with exercise, cough, sputum, wheezing, coughing up blood, and pleurisy. GI: Denies vomiting blood, jaundice, and fecal incontinence.   Denies dysphagia or odynophagia. GU : Denies urinary burning, blood in urine, urinary frequency, urinary hesitancy, nocturnal urination, and urinary incontinence. MS: Denies joint pain, limitation of movement, and swelling, stiffness, low back pain, extremity pain. Denies muscle weakness, cramps, atrophy.  Derm: Denies rash, itching, dry skin, hives, moles, warts, or unhealing ulcers.  Psych: Denies depression, anxiety, memory loss, suicidal ideation, hallucinations, paranoia, and confusion. Heme: Denies bruising, bleeding, and enlarged lymph nodes. Neuro:  Denies any headaches, dizziness, paresthesias. Endo:  Denies any problems with DM, thyroid, adrenal function.   Physical Exam: Vital signs in last 24 hours: General:  Alert, well-developed, in NAD Head:  Normocephalic and atraumatic. Eyes:  Sclera clear, no icterus.   Conjunctiva pink. Ears:  Normal auditory acuity. Mouth:  No deformity or lesions.   Neck:  Supple; no masses . Lungs:  Clear throughout to auscultation.   No wheezes, crackles, or rhonchi. No acute distress. Heart:  Regular rate and rhythm; no murmurs. Abdomen:  Soft, nondistended, nontender. No masses, hepatomegaly. No obvious masses.  Normal bowel .    Rectal:  Deferred   Msk:  Symmetrical without gross deformities.. Pulses:  Normal pulses noted. Extremities:  Without edema. Neurologic:  Alert and  oriented x4;  grossly normal neurologically. Skin:  Intact without significant lesions or rashes. Cervical Nodes:  No significant cervical adenopathy. Psych:  Alert and cooperative. Normal mood and affect.   Impression / Plan:   Personal history of adenomatous colon polyps.  Last colonoscopy in 2015.  She is maintained on Xarelto which was held 2 days prior to the procedure.   Pricilla Riffle. Fuller Plan  04/18/2021, 8:21 AM See Shea Evans, [facility], to contact our on call provider

## 2021-04-18 NOTE — Progress Notes (Signed)
Patient no showed PV today- Called patient and left message to return call by 5 pm today- If no call by 5 pm, PV and procedure will be canceled - no call at 5 pm -  PV and Procedure both canceled- No Show letter mailed to patient  

## 2021-04-18 NOTE — Patient Instructions (Signed)
Information on polyps and diverticulosis given to you today.  Await pathology results.  Resume previous diet and medications.  Resume Xarelto (rivaroxban) at prior dose in 2 days.    YOU HAD AN ENDOSCOPIC PROCEDURE TODAY AT [facility]:   Refer to the procedure report that was given to you for any specific questions about what was found during the examination.  If the procedure report does not answer your questions, please call your gastroenterologist to clarify.  If you requested that your care partner not be given the details of your procedure findings, then the procedure report has been included in a sealed envelope for you to review at your convenience later.  YOU SHOULD EXPECT: Some feelings of bloating in the abdomen. Passage of more gas than usual.  Walking can help get rid of the air that was put into your GI tract during the procedure and reduce the bloating. If you had a lower endoscopy (such as a colonoscopy or flexible sigmoidoscopy) you may notice spotting of blood in your stool or on the toilet paper. If you underwent a bowel prep for your procedure, you may not have a normal bowel movement for a few days.  Please Note:  You might notice some irritation and congestion in your nose or some drainage.  This is from the oxygen used during your procedure.  There is no need for concern and it should clear up in a day or so.  SYMPTOMS TO REPORT IMMEDIATELY:  Following lower endoscopy (colonoscopy or flexible sigmoidoscopy):  Excessive amounts of blood in the stool  Significant tenderness or worsening of abdominal pains  Swelling of the abdomen that is new, acute  Fever of 100F or higher   For urgent or emergent issues, a gastroenterologist can be reached at any hour by calling [phone]. Do not use MyChart messaging for urgent concerns.    DIET:  We do recommend a small meal at first, but then you may proceed to your regular diet.  Drink plenty of fluids but  you should avoid alcoholic beverages for 24 hours.  ACTIVITY:  You should plan to take it easy for the rest of today and you should NOT DRIVE or use heavy machinery until tomorrow (because of the sedation medicines used during the test).    FOLLOW UP: Our staff will call the number listed on your records 48-72 hours following your procedure to check on you and address any questions or concerns that you may have regarding the information given to you following your procedure. If we do not reach you, we will leave a message.  We will attempt to reach you two times.  During this call, we will ask if you have developed any symptoms of COVID 19. If you develop any symptoms (ie: fever, flu-like symptoms, shortness of breath, cough etc.) before then, please call [phone].  If you test positive for Covid 19 in the 2 weeks post procedure, please call and report this information to [place].    If any biopsies were taken you will be contacted by phone or by letter within the next 1-3 weeks.  Please call us at [phone] if you have not heard about the biopsies in 3 weeks.    SIGNATURES/CONFIDENTIALITY: You and/or your care partner have signed paperwork which will be entered into your electronic medical record.  These signatures attest to the fact that that the information above on your After Visit Summary has been reviewed and is understood.  Full responsibility  of the confidentiality of this discharge information lies with you and/or your care-partner.

## 2021-04-18 NOTE — Op Note (Addendum)
[place] Patient Name: Kaitlyn Barnes Procedure Date: 04/18/2021 8:20 AM MRN: [id number] Endoscopist: Ladene Artist , MD Age: 76 Referring MD:  Date of Birth: [birth year] Gender: Female Account #: [id number] Procedure:                Colonoscopy Indications:              Surveillance: Personal history of adenomatous                            polyps on last colonoscopy > 5 years ago Medicines:                Monitored Anesthesia Care Procedure:                Pre-Anesthesia Assessment:                           - Prior to the procedure, a History and Physical                            was performed, and patient medications and                            allergies were reviewed. The patient's tolerance of                            previous anesthesia was also reviewed. The risks                            and benefits of the procedure and the sedation                            options and risks were discussed with the patient.                            All questions were answered, and informed consent                            was obtained. Prior Anticoagulants: The patient has                            taken Xarelto (rivaroxaban), last dose was 2 days                            prior to procedure. ASA Grade Assessment: II - A                            patient with mild systemic disease. After reviewing                            the risks and benefits, the patient was deemed in                            satisfactory condition to undergo the procedure.  After obtaining informed consent, the colonoscope                            was passed under direct vision. Throughout the                            procedure, the patient's blood pressure, pulse, and                            oxygen saturations were monitored continuously. The                            Olympus PCF-H190DL [id number]) Colonoscope was                            introduced  through the anus and advanced to the the                            cecum, identified by appendiceal orifice and                            ileocecal valve. The ileocecal valve, appendiceal                            orifice, and rectum were photographed. The quality                            of the bowel preparation was adequate after                            extensive lavage, suction. The colonoscopy was                            performed without difficulty. The patient tolerated                            the procedure well. Scope In: 8:38:28 AM Scope Out: 8:53:54 AM Scope Withdrawal Time: 0 hours 13 minutes 35 seconds  Total Procedure Duration: 0 hours 15 minutes 26 seconds  Findings:                 The perianal and digital rectal examinations were                            normal.                           A 14 mm polyp was found in the cecum. The polyp was                            sessile. The polyp was removed with a piecemeal                            technique using a cold snare. Resection and  retrieval were complete.                           Two sessile polyps were found in the transverse                            colon and ascending colon. The polyps were 9 to 10                            mm in size. These polyps were removed with a cold                            snare. Resection and retrieval were complete.                           A few medium-mouthed diverticula were found in the                            left colon.                           The exam was otherwise without abnormality on                            direct and retroflexion views. Complications:            No immediate complications. Estimated blood loss:                            None. Estimated Blood Loss:     Estimated blood loss: none. Impression:               - One 14 mm polyp in the cecum, removed piecemeal                            using a cold snare.  Resected and retrieved.                           - Two 9 to 10 mm polyps in the transverse colon and                            in the ascending colon, removed with a cold snare.                            Resected and retrieved.                           - Mild diverticulosis in the left colon.                           - The examination was otherwise normal on direct                            and retroflexion views. Recommendation:           -  Repeat colonoscopy date to be determined, likely                            3 years, after pending pathology results are                            reviewed for surveillance with a more extensive                            bowel prep.                           - Patient has a contact number available for                            emergencies. The signs and symptoms of potential                            delayed complications were discussed with the                            patient. Return to normal activities tomorrow.                            Written discharge instructions were provided to the                            patient.                           - Resume previous diet.                           - Continue present medications.                           - Await pathology results.                           - Resume Xarelto (rivaroxaban) at prior dose in 2                            days. Refer to managing physician for further                            adjustment of therapy. Ladene Artist, MD 04/18/2021 8:58:04 AM This report has been signed electronically.

## 2021-04-20 ENCOUNTER — Telehealth: Payer: Self-pay | Admitting: *Deleted

## 2021-04-20 NOTE — Telephone Encounter (Signed)
°  Follow up Call-  Call back number 04/18/2021  Post procedure Call Back phone  # [phone]  Permission to leave phone message Yes  Some recent data might be hidden    No answer at 2nd attempt follow up phone call.  Unable to leave a message d/t full VM.

## 2021-04-20 NOTE — Telephone Encounter (Signed)
First follow up attempt.  Unable to leave message.

## 2021-04-23 ENCOUNTER — Other Ambulatory Visit (HOSPITAL_BASED_OUTPATIENT_CLINIC_OR_DEPARTMENT_OTHER): Payer: Self-pay

## 2021-04-23 DIAGNOSIS — G4733 Obstructive sleep apnea (adult) (pediatric): Secondary | ICD-10-CM

## 2021-05-01 ENCOUNTER — Encounter: Payer: Self-pay | Admitting: Gastroenterology

## 2021-06-05 ENCOUNTER — Other Ambulatory Visit: Payer: Self-pay | Admitting: Internal Medicine

## 2021-06-05 DIAGNOSIS — E041 Nontoxic single thyroid nodule: Secondary | ICD-10-CM

## 2021-06-07 ENCOUNTER — Ambulatory Visit
Admission: RE | Admit: 2021-06-07 | Discharge: 2021-06-07 | Disposition: A | Discharge status: Home / Self Care | Payer: Medicare HMO | Source: Ambulatory Visit | Attending: Internal Medicine | Admitting: Internal Medicine

## 2021-06-07 ENCOUNTER — Other Ambulatory Visit: Payer: Self-pay

## 2021-06-07 DIAGNOSIS — E041 Nontoxic single thyroid nodule: Secondary | ICD-10-CM

## 2021-06-14 ENCOUNTER — Ambulatory Visit
Admission: RE | Admit: 2021-06-14 | Discharge: 2021-06-14 | Disposition: A | Discharge status: Home / Self Care | Payer: Medicare HMO | Source: Ambulatory Visit | Attending: Internal Medicine | Admitting: Internal Medicine

## 2021-06-14 DIAGNOSIS — Z1231 Encounter for screening mammogram for malignant neoplasm of breast: Secondary | ICD-10-CM

## 2023-03-17 ENCOUNTER — Telehealth: Payer: Self-pay | Admitting: Hematology

## 2023-03-17 ENCOUNTER — Other Ambulatory Visit: Payer: Self-pay | Admitting: Internal Medicine

## 2023-03-17 DIAGNOSIS — Z13820 Encounter for screening for osteoporosis: Secondary | ICD-10-CM

## 2023-03-17 NOTE — Telephone Encounter (Signed)
Spoke with patient confirming upcoming appointment  

## 2023-03-18 ENCOUNTER — Other Ambulatory Visit: Payer: Self-pay | Admitting: Internal Medicine

## 2023-03-18 DIAGNOSIS — Z1231 Encounter for screening mammogram for malignant neoplasm of breast: Secondary | ICD-10-CM

## 2023-04-07 ENCOUNTER — Inpatient Hospital Stay: Payer: Medicare HMO | Admitting: Hematology

## 2023-05-16 ENCOUNTER — Inpatient Hospital Stay: Payer: Medicare HMO | Attending: Hematology | Admitting: Hematology

## 2023-09-02 ENCOUNTER — Other Ambulatory Visit: Payer: Self-pay

## 2023-09-02 NOTE — Patient Outreach (Signed)
 Aging Gracefully Program  09/02/2023  Lanah Steines December 27, 1945 [id number]  [facility] Evaluation Interviewer made contact with patient. Aging Gracefully survey completed.   Interviewer will send referral to RN and OT for follow up.    [facility]  Population Health Care Management Assistant  Direct Dial: [phone]  Fax: [phone] Website: [email or web address]

## 2023-09-18 ENCOUNTER — Encounter: Payer: Self-pay | Admitting: Specialist

## 2023-09-22 ENCOUNTER — Other Ambulatory Visit: Payer: Self-pay | Admitting: Specialist

## 2023-09-25 NOTE — Patient Outreach (Signed)
 Aging Gracefully Program  OT Initial Visit  09/23/2023  Kaitlyn Barnes 12-12-1945 [id number]  Visit:  1- Initial Visit  Start Time:  1645 End Time:  1730 Total Minutes:  45  CCAP: Typical Daily Routine: Typical Daily Routine:: Gets up and completes cleaning tasks.  plays with dog, Spice What Types Of Care Problems Are You Having Throughout The Day?: difficult to complete bathroom adls since sewer issue and to make bed since all bathroom supplies are in her bedroom. What Kind Of Help Do You Receive?: none Do You Think You Need Other Types Of Help?: no What Do You Think Would Make Everyday Life Easier For You?: bathrooms fixed - a walk in shower What Is A Good Day Like?: house is in order What Is A Bad Day Like?: current state of home Do You Have Time For Yourself?: yes Patient Reported Equipment: Patient Reported Equipment Currently Used: Reacher, Grab Bars Functional Mobility-Walking Indoors/Getting Around the House:  No difficulty Functional Mobility-Walk A Block: Walk A Block: A Little Difficulty Functional Mobility-Maintain Balance While Showering: Maintaining Balance While Showering: No Difficulty Functional Mobility-Stooping, Crouching, Kneeling To Retreive Item: Stooping, Crouching, or Kneeling To Retrieve Item: A Little Difficulty Do You:: Use A Device Importance Of Learning New Strategies:: Not At All Functional Mobility-Bending From Standing Position To Pick Up Clothing Off The Floor: Bending Over From Standing Position To Pick Up Clothing Off The Floor: A Little Difficulty Do You:: Use A Device Importance Of Learning New Strategies:: Not At All Functional Mobility-Reaching For Items Above Shoulder Level: Reaching For Items Above Shoulder Level: A Lot Of Difficulty Do You:: Use Both A Device And Personal Assistance Importance Of Learning New Strategies:: Not At All Functional Mobility-Climb 1 Flight Of Stairs: Climb 1 Flight Of Stairs: No  Difficulty Functional Mobility-Move In And Out Of Chair: Move In and Out Of A Chair: No Difficulty Functional Mobility-Move In And Out Of Bed: Move In and Out Of Bed: No Difficulty Functional Mobility-Move In And Out Of Bath/Shower: Move In And Out Of A Bath/Shower: A Lot Of Difficulty Do You:: Use A Device Importance Of Learning New Strategies:: Very Much Observation: Move In And Out Of Bath/Shower: Independent With Pain, Difficulty, Or Use Of Device Safety: A Little Risk Efficiency: Somewhat Intervention: Yes Functional Mobility-Get On And Off Toilet: Getting Up From The Floor: No Difficulty Functional Mobility-Into And Out Of Car, Not Including Driving: Into  And Out Of Car, Not Including Driving: No Difficulty Functional Mobility-Other Mobility Difficulty:      Activities of Daily Living-Bathing/Showering: ADL-Bathing/Showering: No Difficulty Activities of Daily Living-Personal Hygiene and Grooming: Personal Hygiene and Grooming: No Difficulty Activities of Daily Living-Toilet Hygiene: Toilet Hygiene: No Difficulty Activities of Daily Living-Put On And Take Off Undergarments (Incl. Fasteners): Put On And Take Off Undergarments (Incl. Fasteners): No Difficulty Activities of Daily Living-Put On And Take Off Shirt/Dress/Coat (Incl. Fasteners): Put On And Take Off Shirt/Dress/Coat (Incl. Fasteners): No Difficulty Activities of Daily Living-Put On And Take Off Socks And Shoes: Put On And Take Off Socks And  Shoes: No Difficulty Activities of Daily Living-Feed Self: Feed Self: No Difficulty Activities of Daily Living-Rest And Sleep: Rest and Sleep: A Little Difficulty Do You:: No Device/No Assistance Importance Of Learning New Strategies: Not At All Other Comments:: due to house issues - once resolved feels will be sleeping fine Activities of Daily Living-Sexual Activity: Sexual  Activity: No Difficulty Activities of Daily Living-Other Activity Identified:    Instrumental  Activities of Daily Living-Light Homemaking (Laundry,  Straightening Up, Vacuuming):  Do Light Homemaking (Laundry, Straightening Up, Vacuuming): No Difficulty Instrumental Activities of Daily Living-Making A Bed: Making a Bed: No Difficulty Instrumental Activities of Daily Living-Washing Dishes By Hand While Standing At The Sink: Washing Dishes By Hand While Standing At The Sink: No Difficulty Instrumental Activities of Daily Living-Grocery Shopping: Do Grocery Shopping: No Difficulty Instrumental Activities of Daily Living-Use Telephone: Use Telephone: No Difficulty Instrumental Activities of Daily Living-Financial Management: Financial Management: No Difficulty Instrumental Activities of Daily Living-Medications: Take Medications: No Difficulty Instrumental Activities of Daily Living-Health Management And Maintenance: Health Management & Maintenance: No Difficulty Instrumental Activities of Daily Living-Meal Preparation and Clean-Up: Meal Preparation and Clean-Up: No Difficulty Instrumental Activities of Daily Living-Provide Care For Others/Pets: Care For Others/Pets: No Difficulty Instrumental Activities of Daily Living-Take Part In Organized Social Activities: Take Part In Organized Social Activities: N/A Instrumental Activities of Daily Living-Leisure Participation: Leisure Participation: No Difficulty Instrumental Activities of Daily Living-Employment/Volunteer Activities: Employment/Volunteer Activities: N/A Instrumental Activities of Daily Living-Other Identifies:    Readiness To Change Score:  Readiness to Change Score: 8  Home Environment Assessment: Outside Home Entry:: ramp in front.  side entry has steep steps and back entry to deck has steps - Entryway/Foyer:: wfl Dining Room:: wfl Living Room:: wfl Kitchen:: wfl Stairs:: 2 steps to side door - able to ambulate on them without issue Bathroom:: currently in constuction due to sewer leak.  has a tub/shower and would  benefit from a walk in shower and a low commode Master Bedroom:: wfl Laundry:: wfl Basement:: n/a Hallways:: wfl Other Home Environment Concerns:: hole in ceiling of closet  Durable Medical Equipment:    Patient Education: Education Provided: Yes Education Details: check for safety brochure issued and reviewed Person(s) Educated: Patient, Spouse Comprehension: Verbalized Understanding  Goals:  Goals Addressed             This Visit's Progress    Patient Stated       Patient will learn and apply energy conservation strategies to her daily tasks.      Patient Stated       Patient will improve safety getting in and out of shower.        Post Clinical Reasoning: Clinician View Of Client Situation:: Mrs. Kirkwood is very independent and active.  She has accomodated to her daily tasks and mobility as most items in her home are standard height, versus lowered for her needs.  OT introduced the idea of energy conservation and modifications may improve ease of some tasks and she was somewhat receptive. Client View Of His/Her Situation:: Doing well.  She is more concerned for her husbands needs.  Open to the idea of a walk in shower, however concenred about the cost. Next Visit Plan:: Problem solve goal 1 - education on energy conservation.  Orren Blades, MHA, OT/L [phone]

## 2023-10-01 ENCOUNTER — Other Ambulatory Visit: Payer: Self-pay | Admitting: *Deleted

## 2023-10-01 NOTE — Patient Outreach (Signed)
 Aging Gracefully Program  10/01/2023  Kaitlyn Barnes 10-26-1945 [id number]   HIPAA compliant voicemail left for [name] in attempt to schedule Aging Gracefully RN home visit #1. Telephone call made to [name] (spouse) to arrange joint home visit for Monday, June 23 at 1 pm. Provided writer's contact information.   Nolberto Batty, MSN, RN, BSN [place]  [facility], Healthy Communities RN Case Manager for Aging Gracefully Direct Dial: [phone]

## 2023-10-02 ENCOUNTER — Other Ambulatory Visit: Payer: Self-pay | Admitting: Internal Medicine

## 2023-10-02 DIAGNOSIS — Z1231 Encounter for screening mammogram for malignant neoplasm of breast: Secondary | ICD-10-CM

## 2023-10-02 DIAGNOSIS — E041 Nontoxic single thyroid nodule: Secondary | ICD-10-CM

## 2023-10-06 ENCOUNTER — Encounter: Payer: Self-pay | Admitting: *Deleted

## 2023-10-06 ENCOUNTER — Other Ambulatory Visit: Payer: Self-pay | Admitting: *Deleted

## 2023-10-06 NOTE — Patient Instructions (Signed)
 Visit Information  Thank you for taking time to visit with me today. Please don't hesitate to contact me if I can be of assistance to you before our next scheduled home appointment.  Following are the goals we discussed today:   Goals Addressed               This Visit's Progress     AG RN (pt-stated)        10/06/23  Assessment: Mrs. Pope reports having history of DVT but has not been able to take Eliquis or Xarelto  due to the copay. She is also not on Aspirin. States she was referred to hematology in the past for DVT by her PCP. Last seen in by hematologist in 2022. States she recently informed her PCP that she was not taking blood thinner due to copay cost. Mrs. Toman is independent, still drives, and enjoys time with her husband and dog Spike.   Interventions: Provided chronic disease management booklet and writer's contact information. Writer made multiple calls on Mrs. Musil's behalf. Call to PCP to make aware Mrs. Rudnick is not taking prescribed blood thinner and is not on aspirin. Requested copay/discount card for blood thinner he wants her to be on. Call made to Eliquis patient assistance line to receive website information and telephone number for Mrs. Lopp to call and website for application for patient assistance.[facility] [address] that they do not provide [facility] cards. [facility] advised PCP office should provide patient assistance cards if they have them. Confirmed with Dr. Maryla (hematology) nurse that Mrs. Bowditch has not been there since 2022. Dr. Maryla nurse states new referral or new event will be needed to reestablish care with them. Advised Mrs. Sens to follow back up with PCP. Provided patient assistance website addresses and telephone numbers. Discussed questions to ask PCP at next visit. Mrs. Broecker states she has PCP visit on tomorrow.   Plan: Scheduled next home visit on July 29th at 11 am.    CLIENT/RN ACTION PLAN - GENERIC  - (smartphrase AGRNGENERIC)  Registered Nurse:  Pablo Hurst  Date: 10/06/23  Client Name: Donia Bathe Client ID:    Target Area:  SOFIA   Why Problem May Occur: cannot afford copay for Eliquis or Xarelto     Target Goal: Will be on some type of blood thinner (if needed per MD) and will obtain patient assistance for (Eliquis or Xarelto  if needed)  over the next 120 days.     STRATEGIES Coping Strategies: Ideas  Discuss with physicians Keep scheduled MD appointments  Contact pharmacy assistance program Call with questions   Complete pharmacy assist applications Obtain copay assist cards from MD office         Prevention Ideas                  PRACTICE It is important to practice the strategies so we can determine if they will be effective in helping to reach the goal.    Follow these specific recommendations:        If strategy does not work the first time, try it again.     We may make some changes over the next few sessions.       Pablo Hurst, MSN, RN, BSN [place]  [facility], Healthy Communities RN Case Manager for Aging Gracefully Direct Dial: [phone]  Our next appointment is on July 29th at 11 am  If you are experiencing a Mental Health or Behavioral Health Crisis or need someone to talk to, please call the Suicide and Crisis Lifeline: 988 call the [place]  National Suicide Prevention Lifeline: 732-446-4901 or TTY: 612-145-8025 TTY 808 286 9682) to talk to a trained counselor call 1-800-273-TALK (toll free, 24 hour hotline) go to [facility] [address], [place] [phone]) call 911   The patient verbalized understanding of instructions, educational materials, and care plan provided today and agreed to receive a mailed copy of patient instructions, educational materials, and care plan.   Pablo Hurst, MSN, RN, BSN [facility]  [facility], Healthy Communities RN Case Manager for Aging Gracefully Direct Dial: [phone]

## 2023-10-06 NOTE — Patient Outreach (Signed)
 Aging Gracefully Program  RN Visit  10/06/2023  Kaitlyn Barnes [birth year] [id number]  Visit:  RN Visit Number: 1- Initial Visit  RN TIME CALCULATION: Start TIme:  RN Start Time Calculation: 1300 End Time:  RN Stop Time Calculation: 1600 Total Minutes:  RN Time Calculation: 180  Readiness To Change Score:  Readiness to Change Score: 10  Universal RN Interventions: Calendar Distribution: Yes Exercise Review: No Medications: Yes Medication Changes: Yes Mood: Yes Pain: Yes PCP Advocacy/Support: Yes Fall Prevention: Yes Incontinence: Yes Clinician View Of Client Situation: Arrived for joint home visit for Kaitlyn Barnes and husband. Kaitlyn Barnes answered door. Ambulates independently without assistive device. Living room area neat. Dog in kitchen behind doggie gate. Client View Of His/Her Situation: Kaitlyn Barnes reports doing well overall. She still drives and is indenpendent.  Healthcare Provider Communication: Did Surveyor, mining With [employer] Provider?: Yes Method Of Communication: Telephone Healthcare Provider Response According to RN: Dr. Roxane nurse called back to confirm mesage about writer calling to inquire about blood thinners. Kaitlyn Barnes is not on any blood thinners due to copay costs for Eliquis and Xarelto . She is not taking Aspirin either. Writer requesting copay assistance card if he wants her on Eliquis or Xarelto . Tiffany, RN states she will pass message to Dr. Roanna. According to Client, Did PCP Report Communication With An Aging Gracefully RN?: No Healthcare Provider Response According To Client: not yet.  Clinician View of Client Situation: Clinician View Of Client Situation: Arrived for joint home visit for Kaitlyn Barnes and husband. Kaitlyn Barnes answered door. Ambulates independently without assistive device. Living room area neat. Dog in kitchen behind doggie gate. Client's View of His/Her Situation: Client View Of His/Her Situation:  Kaitlyn Barnes reports doing well overall. She still drives and is indenpendent.  Medication Assessment: Reviewed and left message for PCP regarding blood thinner medications.    OT Update: Pending [facility] assessments and contracts.  Session Summary: Kaitlyn Barnes doing well over all. Unable to afford copays for Eliquis and Xarelto . Not on Aspirin. Writer left message for PCP to inquire and make aware.    Goals Addressed               This Visit's Progress     AG RN (pt-stated)        10/06/23  Assessment: Kaitlyn Barnes reports having history of DVT but has not been able to take Eliquis or Xarelto  due to the copay. She is also not on Aspirin. States she was referred to hematology in the past for DVT by her PCP. Last seen in by hematologist in 2022. States she recently informed her PCP that she was not taking blood thinner due to copay cost. Kaitlyn Barnes is independent, still drives, and enjoys time with her husband and dog Spike.   Interventions: Provided chronic disease management booklet and writer's contact information. Writer made multiple calls on [name]'s behalf. Call to PCP to make aware Kaitlyn Barnes is not taking prescribed blood thinner and is not on aspirin. Requested copay/discount card for blood thinner he wants her to be on. Call made to Eliquis patient assistance line to receive website information and telephone number for [name] to call and website for application for patient assistance.[facility] [address] that [facility] not provide [facility] cards. [facility] advised PCP office should provide patient assistance cards if they have them. Confirmed with Dr. Maryla (hematology) nurse that Kaitlyn Barnes has not been there since 2022. Dr. Maryla nurse states new  referral or new event will be needed to reestablish care with them. Advised Kaitlyn Barnes to follow back up with PCP. Provided patient assistance website addresses and telephone numbers. Discussed  questions to ask PCP at next visit. Kaitlyn Barnes states she has PCP visit on tomorrow.   Plan: Scheduled next home visit on July 29th at 11 am.    CLIENT/RN ACTION PLAN - GENERIC - (smartphrase AGRNGENERIC)  Registered Nurse:  Pablo Hurst  Date: 10/06/23  Client Name: Kaitlyn Barnes Client ID:    Target Area:  SOFIA   Why Problem May Occur: cannot afford copay for Eliquis or Xarelto     Target Goal: Will be on some type of blood thinner (if needed per MD) and will obtain patient assistance for (Eliquis or Xarelto  if needed)  over the next 120 days.     STRATEGIES Coping Strategies: Ideas  Discuss with physicians Keep scheduled MD appointments  Contact pharmacy assistance program Call with questions   Complete pharmacy assist applications Obtain copay assist cards from MD office         Prevention Ideas                  PRACTICE It is important to practice the strategies so we can determine if they will be effective in helping to reach the goal.    Follow these specific recommendations:        If strategy does not work the first time, try it again.     We may make some changes over the next few sessions.       Pablo Hurst, MSN, RN, BSN [place]  [facility], Healthy Communities RN Case Manager for Aging Gracefully Direct Dial: [phone]

## 2023-10-08 ENCOUNTER — Telehealth: Payer: Self-pay | Admitting: Hematology

## 2023-10-08 NOTE — Telephone Encounter (Signed)
 Left a voicemail with the scheduled appointment details.

## 2023-10-20 ENCOUNTER — Other Ambulatory Visit: Payer: Self-pay | Admitting: Specialist

## 2023-10-20 NOTE — Patient Outreach (Signed)
 Aging Gracefully Program  OT Follow-Up Visit  10/20/2023  Lummie Montijo 21-Jan-1946 [id number]  Visit:  2- Second Visit  Start Time:  1540 End Time:  1650 Total Minutes:  70  Readiness to Change Score :     Home Environment Assessment:    Durable Medical Equipment:    Patient Education: Education Provided: Yes Education Details: educated paitent on how to safely get up from a fall. Person(s) Educated: Patient Comprehension: Verbalized Understanding, Returned Demonstration  Goals:   Goals Addressed             This Visit's Progress    Patient Stated   On track    Patient will learn and apply energy conservation strategies to her daily tasks.  Discussed the 4 P's of energy conservation:  priortize, pace, plan, position.  Ms. Petros is able to identify currently utilized strategies in each grouping, as well as being receptive to new strategies from OT or handout.   Patient shared one new technique utilized yesterday to clean tub:  sprayed cleaner in tub and then used broom to clean around tub versus scrubbing with a sponge.  This demonstated great ergonomic setup as well as completing task with less effort than scrubbing by hand.          Post Clinical Reasoning: Client Action (Goal) One Interventions: patient will be educated on and be able to apply 4 P's of energy conservation to daily tasks. Did Client Try?: Yes Targeted Problem Area Status: A Little Better Clinician View Of Client Situation:: Mrs. Hannis is doing well.  She shares that she has several upcoming MD appts and is not sure why some have been made with oncology versus being completed at PCP.  Mrs. Edgley is receptive to energy conservation education and already applies many techniques to her daily tasks. Client View Of His/Her Situation:: Continuing to do well.  able to share energy conservation techniques she is currently using and identify some that she would like to try. Next Visit  Plan:: Problem solve goals 2 - how to safely enter and exit shower.  follow up on energy conservation application, as well as life line.  Heather HILARIO Elbe, MHA, OT/L [phone]

## 2023-11-10 ENCOUNTER — Other Ambulatory Visit: Payer: Self-pay | Admitting: *Deleted

## 2023-11-10 NOTE — Patient Outreach (Signed)
 Aging Gracefully Program  11/10/2023  Yassmine Tamm Jan 05, 1946 [id number]   Telephone call made to Mrs. Brinlee to confirm tomorrow's home visit. Mrs. Eads reports their air conditioner just went out. States tomorrow is not a good day for home visit. Discussed writer will contact her next week to see when it will be a good time to reschedule joint home visit for Mrs. Hamza and spouse.    Pablo Hurst, MSN, RN, BSN [place]  [facility], Healthy Communities RN Case Manager for Aging Gracefully Direct Dial: [phone]

## 2023-11-11 ENCOUNTER — Encounter: Payer: Self-pay | Admitting: *Deleted

## 2023-11-14 ENCOUNTER — Other Ambulatory Visit: Payer: Self-pay

## 2023-11-14 DIAGNOSIS — I82402 Acute embolism and thrombosis of unspecified deep veins of left lower extremity: Secondary | ICD-10-CM

## 2023-11-16 NOTE — Progress Notes (Incomplete)
 HEMATOLOGY/ONCOLOGY CLINIC NOTE  Date of Service: 11/17/2023  Patient Care Team: Roanna Ezekiel NOVAK, MD as PCP - General (Internal Medicine) Jason Heather DEL, OTR as Occupational Therapist (Occupational Therapy) Shona Pablo MATSU, RN as Case Manager  CHIEF COMPLAINTS/PURPOSE OF CONSULTATION:  Follow-up for anticoagulation recommendations for DVT Follow-up for positive anticardiolipin IgM antibody   HISTORY OF PRESENTING ILLNESS:    Kaitlyn Barnes is a wonderful 78 y.o. female who has been referred to us  by Dr. Roanna for evaluation and management of DVT. The pt reports that she is doing well overall.   The pt reports that she had no blood clots prior to her event in October. She has no family history of blood clots, blood disorders, or cancers. Pt began to exercise and lost over 15 lbs over the last year. During one of her exercise sessions the pt felt a sharp pain in her left thigh. The discomfort was so bothersome that she struggled to walk. After this occurred she stretched, took two Tylenol , and laid down. When the patient awoke she noticed that her entire left leg, from her groid to her ankle, was swollen. Two days later she went to the ED where they completed an ultrasound of her left leg. Pt denies any vaccinations, injury to the area, long-distance travel, or surgery directly before her clot. Pt was started on Actonel in the months before her clot, but asked to stop due to the medication causing fevers, chills, nausea, and vomiting. She held the medication about three weeks prior to the clotting event. Pt was smoking at the time of her blood clot.  She still has some left leg swelling and has been using compression socks. She feels that her leg swelling is better by at least 50%. Pt has also continued smoking.   Pt has a history of achondroplasia and hypothyroidism. Pt is unsure why she has chronic hypokalemia or thrombocytopenia. She has continued Folic acid  and Vitamin B12  replacement since her hospital visit. Pt is up to date with age appropriate cancer screenings. Pt has been struggling emotionally with the passing of her mother last year, as they were really close.  Of note prior to the patient's visit today, pt has had US  Lower Extremity Venous Left completed on 01/30/2020 with results revealing RIGHT: - No evidence of common femoral or external iliac vein obstruction. LEFT: - Findings consistent with acute deep vein thrombosis involving the left common femoral vein, SF junction, left femoral vein, left popliteal vein, left posterior tibial veins, left peroneal veins, left soleal veins, left gastrocnemius veins, and EIV, mid to distal CIV.  Most recent lab results (02/17/2020) of CBC w/diff & CMP is as follows: all values are WNL except for WBC at 3.4K, MCV at 106.1, MCH at 36.6, Potassium at 3.2, Globulin at 3.8. 02/17/2020 Homocysteine at 33.7 02/17/2020 Folate at 2.8  On review of systems, pt reports left left swelling and denies unexpected weight loss, abnormal vaginal discharge, abdominal pain, urinary habit changes, bowel habit changes, fevers, chills, night sweats, chest pain SOB and any other symptoms.   On PMHx the pt reports Achondroplasia, Hypothyroidism, HTN. On Social Hx the pt reports that she smokes 3-5 cigarettes per day. She denies any heavier smoking history previously.  INTERVAL HISTORY:  Kaitlyn Barnes is a 78 y.o. female here for follow-up for history of extensive left-sided DVT.  Patient was last seen by me on 03/19/2021 and reported continued complete resolution of left lower extremity pain and swelling.  She was discharged back to her PCP.   She has since been referred back to us  by Mobalaji B. Roanna, MD for management of her Hx of DVT.     MEDICAL HISTORY:  Past Medical History:  Diagnosis Date   Allergy    Arthritis    Cataract    Cataract    Clotting disorder (HCC)    Complication of anesthesia    DVT (deep venous  thrombosis) (HCC)    leg, 2 years ago per pt   Dysrhythmia    irregular   GERD (gastroesophageal reflux disease)    Glaucoma    HLD (hyperlipidemia)    Hypertension    Hypothyroidism    Low serum potassium    PONV (postoperative nausea and vomiting)    nausea after C-Section   Seasonal allergies    Sleep apnea    no cpap    SURGICAL HISTORY: Past Surgical History:  Procedure Laterality Date   ABDOMINAL HYSTERECTOMY     BACK SURGERY  10/21/2012   Dr.Betero   CATARACT EXTRACTION Bilateral    CESAREAN SECTION     COLONOSCOPY     DENTAL SURGERY     removed    SOCIAL HISTORY: Social History   Socioeconomic History   Marital status: Widowed    Spouse name: Not on file   Number of children: 2   Years of education: Not on file   Highest education level: Not on file  Occupational History   Occupation: retired  Tobacco Use   Smoking status: Every Day    Current packs/day: 0.25    Average packs/day: 0.3 packs/day for 20.0 years (5.0 ttl pk-yrs)    Types: Cigarettes   Smokeless tobacco: Never  Vaping Use   Vaping status: Never Used  Substance and Sexual Activity   Alcohol use: Yes    Alcohol/week: 3.0 standard drinks of alcohol    Types: 3 Glasses of wine per week    Comment: weekends   Drug use: No   Sexual activity: Not on file  Other Topics Concern   Not on file  Social History Narrative   Not on file   Social Drivers of Health   Financial Resource Strain: Not on file  Food Insecurity: Not on file  Transportation Needs: Not on file  Physical Activity: Not on file  Stress: Not on file  Social Connections: Not on file  Intimate Partner Violence: Not on file    FAMILY HISTORY: Family History  Problem Relation Age of Onset   Diabetes Mother    Pancreatic disease Mother    Hypertension Mother    Kidney disease Mother    Diabetes Father    Hypertension Father    Hypertension Maternal Grandmother    Diabetes Daughter    Pancreatic disease Daughter     Kidney disease Daughter    Colon cancer Neg Hx    Esophageal cancer Neg Hx    Stomach cancer Neg Hx    Rectal cancer Neg Hx     ALLERGIES:  is allergic to other, dog epithelium (canis lupus familiaris), and sulfa antibiotics.  MEDICATIONS:  Current Outpatient Medications  Medication Sig Dispense Refill   alendronate (FOSAMAX) 70 MG tablet  (Patient not taking: Reported on 10/06/2023)     amLODipine -benazepril  (LOTREL) 10-40 MG capsule Take 1 capsule by mouth daily.     cetirizine (ZYRTEC) 10 MG tablet Take 10 mg by mouth daily.     Cholecalciferol 1.25 MG (50000 UT) capsule cholecalciferol (vitamin D3)  1,250 mcg (50,000 unit) capsule  TAKE 1 CAPSULE EVERY WEEK BY ORAL ROUTE.     cyanocobalamin 1000 MCG tablet cyanocobalamin (vit B-12) 1,000 mcg tablet  Take 1 tablet every day by oral route.     ergocalciferol (VITAMIN D2) 1.25 MG (50000 UT) capsule ergocalciferol (vitamin D2) 1,250 mcg (50,000 unit) capsule  TAKE 1 CAPSULE BY MOUTH ONE TIME PER WEEK     fluticasone (FLONASE) 50 MCG/ACT nasal spray fluticasone propionate 50 mcg/actuation nasal spray,suspension     folic acid  (FOLVITE ) 1 MG tablet folic acid  1 mg tablet     GuaiFENesin (MUCINEX PO) Take 1 tablet by mouth daily.     levothyroxine  (SYNTHROID ) 100 MCG tablet Take 100 mcg by mouth daily before breakfast. (Patient taking differently: Take 100 mcg by mouth 3 (three) times a week. Takes on Mondays, Wednesdays, Fridays)     levothyroxine  (SYNTHROID ) 150 MCG tablet Take 150 mcg by mouth daily. (Patient not taking: Reported on 10/06/2023)     levothyroxine  (SYNTHROID ) 88 MCG tablet Take 88 mcg by mouth daily before breakfast. (Patient taking differently: Take 88 mcg by mouth 4 (four) times a week. Takes Tuesdays, Thursdays, Saturdays, and Sundays)     montelukast (SINGULAIR) 10 MG tablet Take 10 mg by mouth at bedtime.     omeprazole (PRILOSEC) 20 MG capsule Take 20 mg by mouth daily.     potassium chloride  SA (KLOR-CON ) 20 MEQ  tablet potassium chloride  ER 20 mEq tablet,extended release  take 1 tab per day     potassium gluconate 595 MG TABS tablet Take 595 mg by mouth daily. (Patient not taking: Reported on 10/06/2023)     rivaroxaban  (XARELTO ) 20 MG TABS tablet Xarelto  20 mg tablet  Take 1 tablet every day by oral route for 30 days. (Patient not taking: Reported on 10/06/2023)     rosuvastatin (CRESTOR) 10 MG tablet 20 mg.     Current Facility-Administered Medications  Medication Dose Route Frequency Provider Last Rate Last Admin   0.9 %  sodium chloride  infusion  500 mL Intravenous Once Aneita Gwendlyn DASEN, MD        REVIEW OF SYSTEMS:   .10 Point review of Systems was done is negative except as noted above.   PHYSICAL EXAMINATION: ECOG PERFORMANCE STATUS: 1 - Symptomatic but completely ambulatory  . There were no vitals filed for this visit.  There were no vitals filed for this visit.  .There is no height or weight on file to calculate BMI.  GENERAL:alert, in no acute distress and comfortable SKIN: no acute rashes, no significant lesions EYES: conjunctiva are pink and non-injected, sclera anicteric OROPHARYNX: MMM, no exudates, no oropharyngeal erythema or ulceration NECK: supple, no JVD LYMPH:  no palpable lymphadenopathy in the cervical, axillary or inguinal regions LUNGS: clear to auscultation b/l with normal respiratory effort HEART: regular rate & rhythm ABDOMEN:  normoactive bowel sounds , non tender, not distended. Extremity: no pedal edema PSYCH: alert & oriented x 3 with fluent speech NEURO: no focal motor/sensory deficits   LABORATORY DATA:  I have reviewed the data as listed  .    Latest Ref Rng & Units 03/19/2021   10:18 AM 08/25/2020   10:00 AM 03/27/2020   12:42 PM  CBC  WBC 4.0 - 10.5 K/uL 3.5  2.8    Hemoglobin 12.0 - 15.0 g/dL 86.8  86.7    Hematocrit 36.0 - 46.0 % 37.1  37.4  47.8   Platelets 150 - 400 K/uL 108  167      .  Latest Ref Rng & Units 03/19/2021   10:18  AM 08/25/2020   10:00 AM 03/27/2020   12:41 PM  CMP  Glucose 70 - 99 mg/dL 91  89  95   BUN 8 - 23 mg/dL 13  7  11    Creatinine 0.44 - 1.00 mg/dL 9.15  9.20  9.10   Sodium 135 - 145 mmol/L 142  141  142   Potassium 3.5 - 5.1 mmol/L 3.8  3.0  3.1   Chloride 98 - 111 mmol/L 110  106  106   CO2 22 - 32 mmol/L 21  26  28    Calcium  8.9 - 10.3 mg/dL 9.3  8.9  9.4   Total Protein 6.5 - 8.1 g/dL 8.5  8.0  8.6   Total Bilirubin 0.3 - 1.2 mg/dL 0.5  0.6  0.7   Alkaline Phos 38 - 126 U/L 72  74  73   AST 15 - 41 U/L 29  17  17    ALT 0 - 44 U/L 18  <6  9    Anticardiolipin antibody testing from today pending   RADIOGRAPHIC STUDIES: I have personally reviewed the radiological images as listed and agreed with the findings in the report. No results found.  ASSESSMENT & PLAN:   77 yo female with achondroplasia with   1) Extensive Left lower extremity DVT  May Thurner syndrome was a consideration but CT venogram from 04/20/2020 showed no evidence of pelvic or caval DVT narrowing or thrombosis Patient is cardiolipin IgM positive -repeat labs from today pending. Active smoking was a risk factor and continues to be one as well. Previous thrombophilia work-up was otherwise unrevealing.  PLAN:   FOLLOW UP: ***  The total time spent in the appointment was *** minutes* .  All of the patient's questions were answered with apparent satisfaction. The patient knows to call the clinic with any problems, questions or concerns.   Emaline Saran MD MS AAHIVMS [facility] Hematology/Oncology Physician [facility]  .*Total Encounter Time as defined by the Centers for Medicare and Medicaid Services includes, in addition to the face-to-face time of a patient visit (documented in the note above) non-face-to-face time: obtaining and reviewing outside history, ordering and reviewing medications, tests or procedures, care coordination (communications with other health care professionals or caregivers)  and documentation in the medical record.    I,Mitra Faeizi,acting as a Neurosurgeon for Emaline Saran, MD.,have documented all relevant documentation on the behalf of Emaline Saran, MD,as directed by  Emaline Saran, MD while in the presence of Emaline Saran, MD.  ***

## 2023-11-17 ENCOUNTER — Inpatient Hospital Stay: Admitting: Hematology

## 2023-11-17 ENCOUNTER — Inpatient Hospital Stay

## 2023-11-17 ENCOUNTER — Telehealth: Payer: Self-pay | Admitting: Hematology

## 2023-11-18 ENCOUNTER — Other Ambulatory Visit: Payer: Self-pay | Admitting: *Deleted

## 2023-11-18 NOTE — Patient Outreach (Signed)
 Aging Gracefully Program  11/18/2023  Wilson Sample 08-10-1945 [id number]   Unsuccessful call attempt to reach Mrs. Hilley to schedule Aging Gracefully RN joint home visit with Mrs. Weissberg and her husband. However, no answer. HIPAA compliant voicemail message left to request return call.   Pablo Hurst, MSN, RN, BSN [place]  [facility], Healthy Communities RN Case Manager for Aging Gracefully Direct Dial: [phone]

## 2023-11-25 ENCOUNTER — Other Ambulatory Visit: Payer: Self-pay | Admitting: *Deleted

## 2023-11-25 NOTE — Patient Outreach (Signed)
 Aging Gracefully Program  11/25/2023  Kaitlyn Barnes 1946-01-14 [id number]  Telephone call attempt made to Kaitlyn Barnes to schedule joint Aging Gracefully RN home visit with Kaitlyn Barnes and her husband. No answer. HIPAA compliant voicemail message left.    Pablo Hurst, MSN, RN, BSN [place]  [facility], Healthy Communities RN Case Manager for Aging Gracefully Direct Dial: [phone]

## 2023-11-28 ENCOUNTER — Other Ambulatory Visit: Payer: Self-pay | Admitting: *Deleted

## 2023-11-28 NOTE — Patient Outreach (Signed)
 Aging Gracefully Program  11/28/2023  Naamah Boggess [birth year] [id number]   Attempted to reach Mrs. Towner again to schedule Aging Gracefully RN joint home visit #2 for Mrs. Brandenburger and husband. No answer. HIPAA compliant voicemail message left to request return call.   Pablo Hurst, MSN, RN, BSN [place]  [facility], Healthy Communities RN Case Manager for Aging Gracefully Direct Dial: [phone]

## 2023-12-03 ENCOUNTER — Inpatient Hospital Stay

## 2023-12-03 ENCOUNTER — Inpatient Hospital Stay: Admitting: Hematology

## 2023-12-05 ENCOUNTER — Other Ambulatory Visit: Payer: Self-pay | Admitting: *Deleted

## 2023-12-05 NOTE — Patient Outreach (Signed)
 Telephone call made to Mrs. Layman to schedule joint AG RN home visit. However, no answer. Voicemail box. Unable to leave voicemail message.   Telephone call made to [name] (spouse) to schedule joint home visit.  [name] agreeable to joint visit for [name] and Mrs. Layman on 12/10/23 at 3pm.  Pablo Hurst, MSN, RN, BSN [place]  [facility], Healthy Communities RN Post- Acute Care Manager Direct Dial: [phone]

## 2023-12-09 ENCOUNTER — Encounter: Payer: Self-pay | Admitting: *Deleted

## 2023-12-10 ENCOUNTER — Encounter: Payer: Self-pay | Admitting: *Deleted

## 2023-12-10 ENCOUNTER — Other Ambulatory Visit: Payer: Self-pay | Admitting: *Deleted

## 2023-12-10 NOTE — Patient Outreach (Signed)
 Aging Gracefully Program  RN Visit  12/10/2023  Kaitlyn Barnes Mar 20, 1946 [id number]  Visit:  RN Visit Number: 2- Second Visit  RN TIME CALCULATION: Start TIme:  RN Start Time Calculation: 1400 End Time:  RN Stop Time Calculation: 1430 Total Minutes:  RN Time Calculation: 30  Readiness To Change Score:  Readiness to Change Score: 8.67  Universal RN Interventions: Calendar Distribution: No Exercise Review: Yes Medications: Yes Medication Changes: Yes Mood: Yes Pain: Yes PCP Advocacy/Support: No Fall Prevention: Yes Incontinence: Yes Clinician View Of Client Situation: Arrived for joint home visit for Kaitlyn Barnes and spouse. Husband answered door. Kaitlyn Barnes was doing housework in kitchen. Front room with clutter corners. Client View Of His/Her Situation: Kaitlyn Barnes reports she has been extremely busy doing house work. States last contractors left a lot of dust when they finished doing home repairs. She plans to sign [employer] contract next week.  Healthcare Provider Communication: Did Surveyor, mining With [employer] Provider?: No Healthcare Provider Response According to RN: n/a According to Client, Did PCP Report Communication With An Aging Gracefully RN?: No Healthcare Provider Response According To Client: n/a  Clinician View of Client Situation: Clinician View Of Client Situation: Arrived for joint home visit for Kaitlyn Barnes and spouse. Husband answered door. Kaitlyn Barnes was doing housework in kitchen. Front room with clutter corners. Client's View of His/Her Situation: Client View Of His/Her Situation: Kaitlyn Barnes reports she has been extremely busy doing house work. States last contractors left a lot of dust when they finished doing home repairs. She plans to sign [employer] contract next week.  Medication Assessment: reviewed    OT Update: pending [employer] contracts and assessment  Session Summary: Kaitlyn Barnes is currently under the weather. States it is  allergies.    Goals Addressed               This Visit's Progress     AG RN (pt-stated)        10/06/23  Assessment: Kaitlyn Barnes reports having history of DVT but has not been able to take Eliquis or Xarelto  due to the copay. She is also not on Aspirin. States she was referred to hematology in the past for DVT by her PCP. Last seen in by hematologist in 2022. States she recently informed her PCP that she was not taking blood thinner due to copay cost. Kaitlyn Barnes is independent, still drives, and enjoys time with her husband and dog Spike.   Interventions: Provided chronic disease management booklet and writer's contact information. Writer made multiple calls on Kaitlyn Barnes's behalf. Call to PCP to make aware Kaitlyn Barnes is not taking prescribed blood thinner and is not on aspirin. Requested copay/discount card for blood thinner he wants her to be on. Call made to Eliquis patient assistance line to receive website information and telephone number for Kaitlyn Barnes to call and website for application for patient assistance.Confirmed with [facility] on [address] that they do not provide patient assistance cards. [facility] advised PCP office should provide patient assistance cards if they have them. Confirmed with Dr. Maryla (hematology) nurse that Kaitlyn Barnes has not been there since 2022. Dr. Maryla nurse states new referral or new event will be needed to reestablish care with them. Advised Kaitlyn Barnes to follow back up with PCP. Provided patient assistance website addresses and telephone numbers. Discussed questions to ask PCP at next visit. Kaitlyn Barnes states she has PCP visit on tomorrow.   Plan: Scheduled next home visit on July 29th  at 11 am.    CLIENT/RN ACTION PLAN - GENERIC - (smartphrase AGRNGENERIC)  Registered Nurse:  Pablo Hurst  Date: 10/06/23  Client Name: Kaitlyn Barnes Client ID:    Target Area:  [place]   Why Problem May Occur: cannot afford copay for Eliquis or Xarelto      Target Goal: Will be on some type of blood thinner (if needed per MD) and will obtain patient assistance for (Eliquis or Xarelto  if needed)  over the next 120 days.     STRATEGIES Coping Strategies: Ideas  Discuss with physicians Keep scheduled MD appointments  Contact pharmacy assistance program Call with questions   Complete pharmacy assist applications Obtain copay assist cards from MD office         Prevention Ideas                  PRACTICE It is important to practice the strategies so we can determine if they will be effective in helping to reach the goal.    Follow these specific recommendations:        If strategy does not work the first time, try it again.     We may make some changes over the next few sessions.       Pablo Hurst, MSN, RN, BSN [facility], Healthy Communities RN Case Manager for Aging Gracefully Direct Dial: [phone]     12/10/23  Assessment: Kaitlyn Barnes states she has been under the weather lately. States she is suffering from allergies due to the large amount of dust left from recent contractors in the home. Not [facility]. Reports having an allergic reaction that required Benadryl . States her allergy flares usually last a week. Report taking medications as prescribed. States she is now on aspirin. Before she was not on any blood thinners. States hematology appt had to be cancelled due to her respiratory symptoms. States she is supposed to call to reschedule once she starts feeling better. Denies pain, falls, fever, chills, or SOB. States she has been extremely busy cleaning up after repair work was done.   Interventions: Provided AG exercise booklet. Encouraged Kaitlyn Barnes. Schwan to take frequent rest breaks throughout the day to conserve energy. Advised Kaitlyn Barnes. Dement to contact PCP if she is not feeling any better next week. Encouraged to take medications as prescribed. Discussed importance of hematology appointment related  to her history of blood clots.  Plan: Scheduled next AG RN home visit for 01/08/24 at 3 pm.   Pablo Hurst, MSN, RN, BSN [place]  [facility], Healthy Communities RN Case Manager for Aging Gracefully Direct Dial: [phone]                                                            Pablo Hurst, MSN, RN, BSN [place]  [facility], Healthy Communities RN Case Manager for Aging Gracefully Direct Dial: [phone]

## 2023-12-10 NOTE — Patient Instructions (Signed)
 Visit Information  Thank you for taking time to visit with me today. Please don't hesitate to contact me if I can be of assistance to you before our next scheduled home appointment.  Following are the goals we discussed today:   Goals Addressed               This Visit's Progress     AG RN (pt-stated)        10/06/23  Assessment: Kaitlyn Barnes reports having history of DVT but has not been able to take Eliquis or Xarelto  due to the copay. She is also not on Aspirin. States she was referred to hematology in the past for DVT by her PCP. Last seen in by hematologist in 2022. States she recently informed her PCP that she was not taking blood thinner due to copay cost. Kaitlyn Barnes is independent, still drives, and enjoys time with her husband and dog Spike.   Interventions: Provided chronic disease management booklet and writer's contact information. Writer made multiple calls on Kaitlyn Barnes's behalf. Call to PCP to make aware Kaitlyn Barnes is not taking prescribed blood thinner and is not on aspirin. Requested copay/discount card for blood thinner he wants her to be on. Call made to Eliquis patient assistance line to receive website information and telephone number for Kaitlyn Barnes to call and website for application for patient assistance.[facility] [address] that [facility] not provide [facility] cards. [facility] advised [facility] office should provide patient assistance cards if they have them. Confirmed with Dr. Maryla (hematology) nurse that Kaitlyn Barnes has not been there since 2022. Dr. Maryla nurse states new referral or new event will be needed to reestablish care with them. Advised Kaitlyn Barnes to follow back up with PCP. Provided patient assistance website addresses and telephone numbers. Discussed questions to ask PCP at next visit. Kaitlyn Barnes states she has PCP visit on tomorrow.   Plan: Scheduled next home visit on July 29th at 11 am.    CLIENT/RN ACTION PLAN - GENERIC  - (smartphrase AGRNGENERIC)  Registered Nurse:  Pablo Hurst  Date: 10/06/23  Client Name: Kaitlyn Barnes Client ID:    Target Area:  SOFIA   Why Problem May Occur: cannot afford copay for Eliquis or Xarelto     Target Goal: Will be on some type of blood thinner (if needed per MD) and will obtain patient assistance for (Eliquis or Xarelto  if needed)  over the next 120 days.     STRATEGIES Coping Strategies: Ideas  Discuss with physicians Keep scheduled MD appointments  Contact pharmacy assistance program Call with questions   Complete pharmacy assist applications Obtain copay assist cards from MD office         Prevention Ideas                  PRACTICE It is important to practice the strategies so we can determine if they will be effective in helping to reach the goal.    Follow these specific recommendations:        If strategy does not work the first time, try it again.     We may make some changes over the next few sessions.       Pablo Hurst, MSN, RN, BSN [facility], Healthy Communities RN Case Manager for Aging Gracefully Direct Dial: [phone]     12/10/23  Assessment: Kaitlyn Barnes states she has been under the weather lately. States she is suffering from allergies due  to the large amount of dust left from recent contractors in the home. Not CHS. Reports having an allergic reaction that required Benadryl . States her allergy flares usually last a week. Report taking medications as prescribed. States she is now on aspirin. Before she was not on any blood thinners. States hematology appt had to be cancelled due to her respiratory symptoms. States she is supposed to call to reschedule once she starts feeling better. Denies pain, falls, fever, chills, or SOB. States she has been extremely busy cleaning up after repair work was done.   Interventions: Provided AG exercise booklet. Encouraged Kaitlyn Barnes to take frequent rest breaks  throughout the day to conserve energy. Advised Kaitlyn Barnes to contact PCP if she is not feeling any better next week. Encouraged to take medications as prescribed. Discussed importance of hematology appointment related to her history of blood clots.  Plan: Scheduled next AG RN home visit for 01/08/24 at 3 pm.   Pablo Hurst, MSN, RN, BSN [place]  [facility], Healthy Communities RN Case Manager for Aging Gracefully Direct Dial: [phone]                                                              Our next appointment is on 01/08/24 at 3pm.  If you are experiencing a Mental Health or Behavioral Health Crisis or need someone to talk to, please call the Suicide and Crisis Lifeline: 988 call the [place]  National Suicide Prevention Lifeline: 980 089 2190 or TTY: 609 624 1386 TTY 306-282-5872) to talk to a trained counselor call 1-800-273-TALK (toll free, 24 hour hotline) go to [facility] [address] [place] [phone]) call 911   The patient verbalized understanding of instructions, educational materials, and care plan provided today and agreed to receive a mailed copy of patient instructions, educational materials, and care plan.   Pablo Hurst, MSN, RN, BSN [place]  [facility], Healthy Communities RN Case Manager for Aging Gracefully Direct Dial: [phone]

## 2024-01-08 ENCOUNTER — Encounter: Payer: Self-pay | Admitting: *Deleted

## 2024-01-08 ENCOUNTER — Other Ambulatory Visit: Payer: Self-pay | Admitting: *Deleted

## 2024-01-08 NOTE — Patient Outreach (Addendum)
 Aging Gracefully Program  01/08/2024  Kaitlyn Barnes 05/10/1945 [id number]   1500 Arrived for Aging Gracefully joint home visit. However, [name] states she is having an allergic reaction to possibly the dog. States she just took Benadryl . Surveyor, mining to come back another time. Discussed writer will call back tomorrow to reschedule. [name] advised to call 911 should symptoms worsen. States she had an allergic reaction recently. Denies SOB, difficulty swallowing, hoarseness, chest pain, and dizziness. States she is not going to the hospital or calling 911. Writer asked [name] (spouse) to call 911 should symptoms worsen. [name] states he will.  [phone] Telephone call made to [name] to check on [name]. [name] states [name] is resting now. [name] states he will call 911 should [name] have SOB, CP, difficulty swallowing and etc. States he is keeping a close eye on her. [name] denies any concerns at the time of writer's call.   Pablo Hurst, MSN, RN, BSN [place]  [facility], Healthy Communities RN Case Manager for Aging Gracefully Direct Dial: [phone]

## 2024-01-16 ENCOUNTER — Other Ambulatory Visit: Payer: Self-pay | Admitting: *Deleted

## 2024-01-16 NOTE — Patient Outreach (Signed)
 Aging Gracefully Program  01/16/2024  Kaitlyn Barnes October 15, 1945 [id number]  Telephone call made to Kaitlyn Barnes to schedule joint AG RN home visit with Kaitlyn Barnes and [name]. Kaitlyn Barnes endorses [name] (wife) is feeling better now. Agreeable to next home visit for Wednesday, October 15th at 2 pm.   Pablo Hurst, MSN, RN, BSN [place]  [facility], Healthy Communities RN Case Manager for Aging Gracefully Direct Dial: [phone]

## 2024-01-17 IMAGING — MG MM DIGITAL SCREENING BILAT W/ TOMO AND CAD
8 series · 9 of 24 positions shown · non-contrast
Comparison: Previous exam(s).

CLINICAL DATA: Screening.

EXAM:
DIGITAL SCREENING BILATERAL MAMMOGRAM WITH TOMOSYNTHESIS AND CAD
TECHNIQUE: Bilateral screening digital craniocaudal and mediolateral oblique
mammograms were obtained. Bilateral screening digital breast
tomosynthesis was performed. The images were evaluated with
computer-aided detection.

[R CC synth-2D]
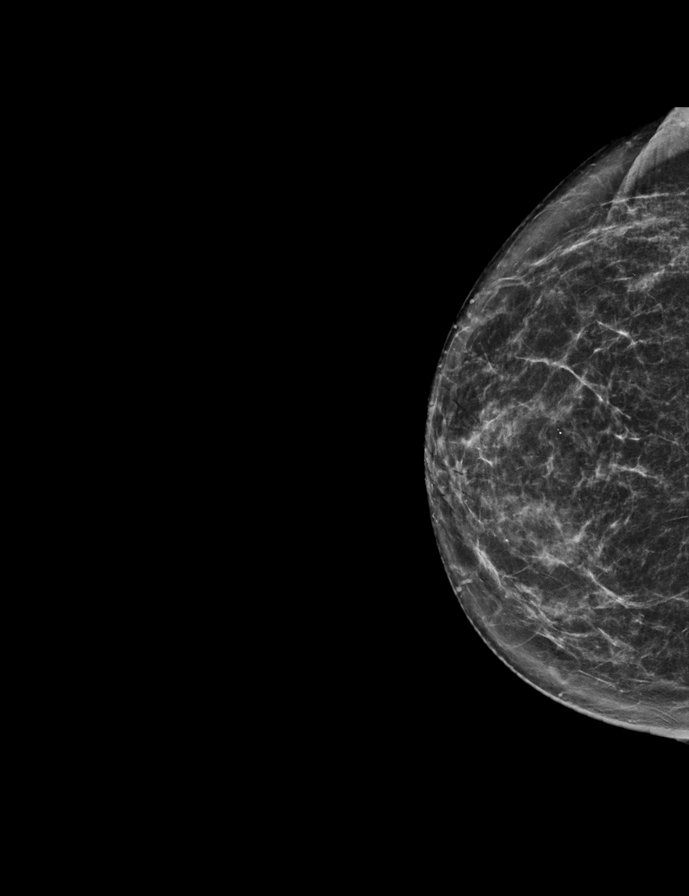

[L MLO synth-2D]
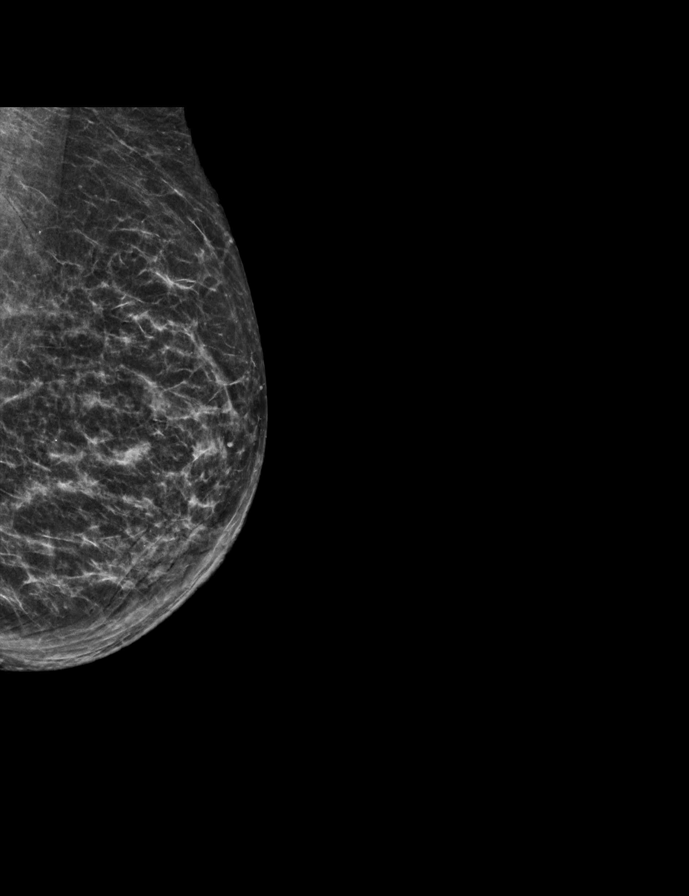

[R MLO synth-2D]
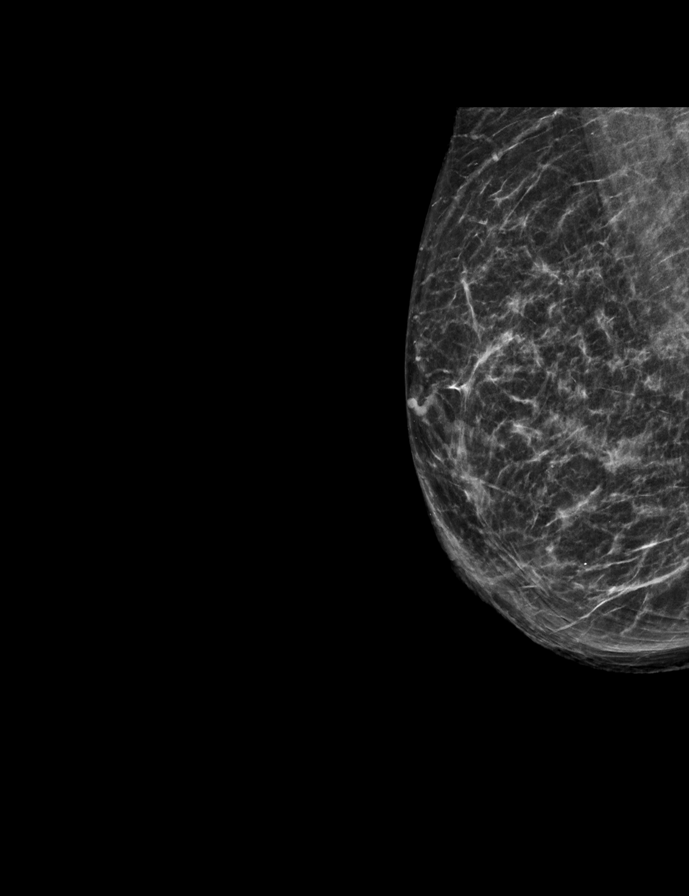

[L CC synth-2D]
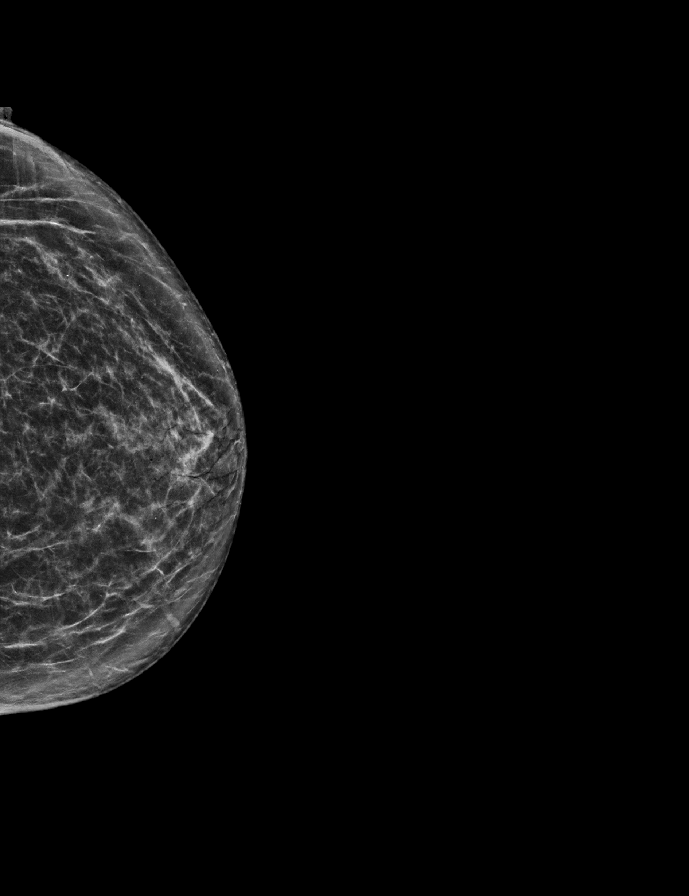

[L CC tomo · 2 of 63 frames shown]
[frame 21/63]
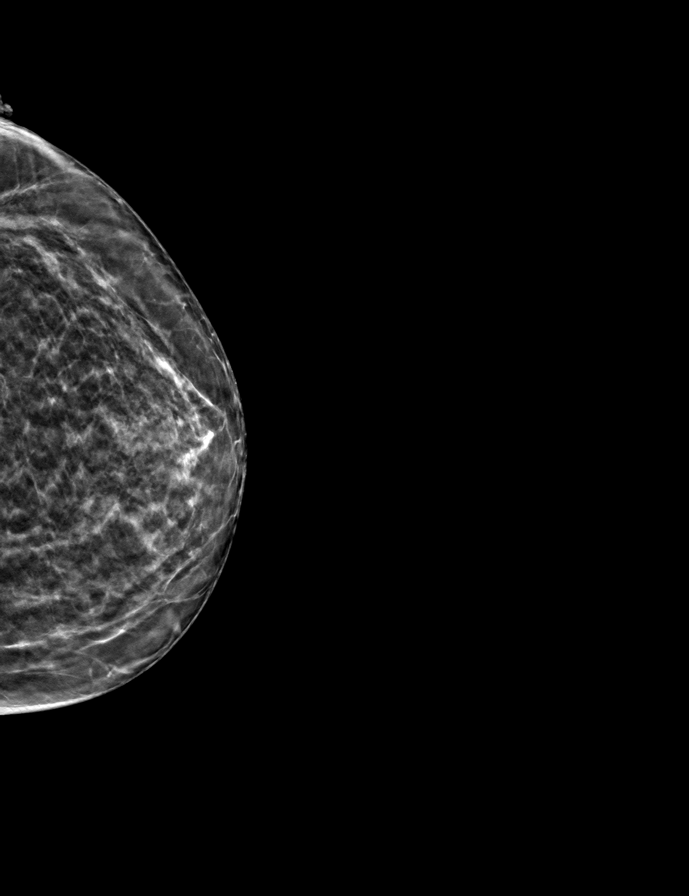
[frame 32/63]
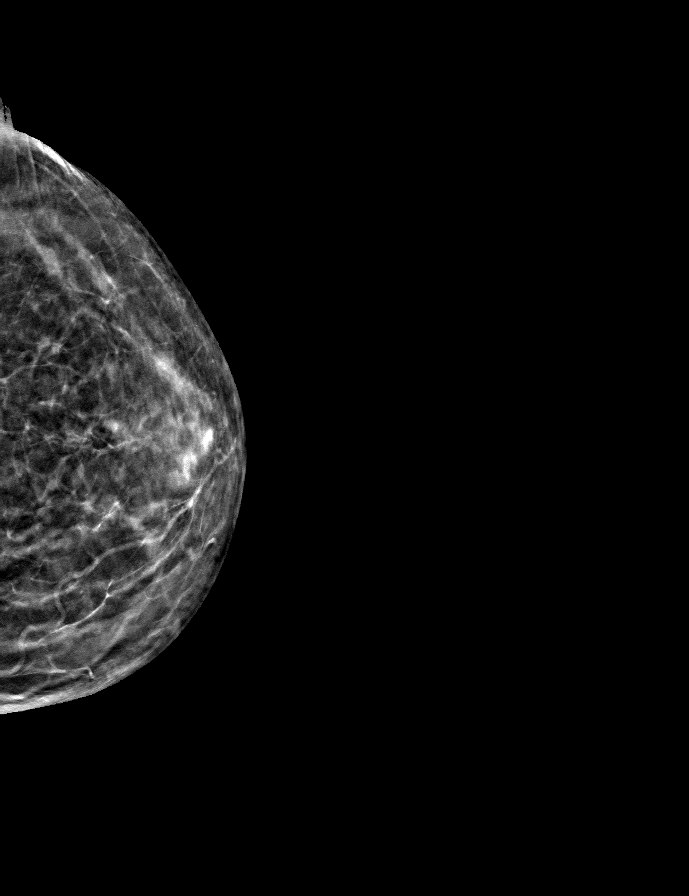

[R MLO tomo · tomo slice 31/60.0]
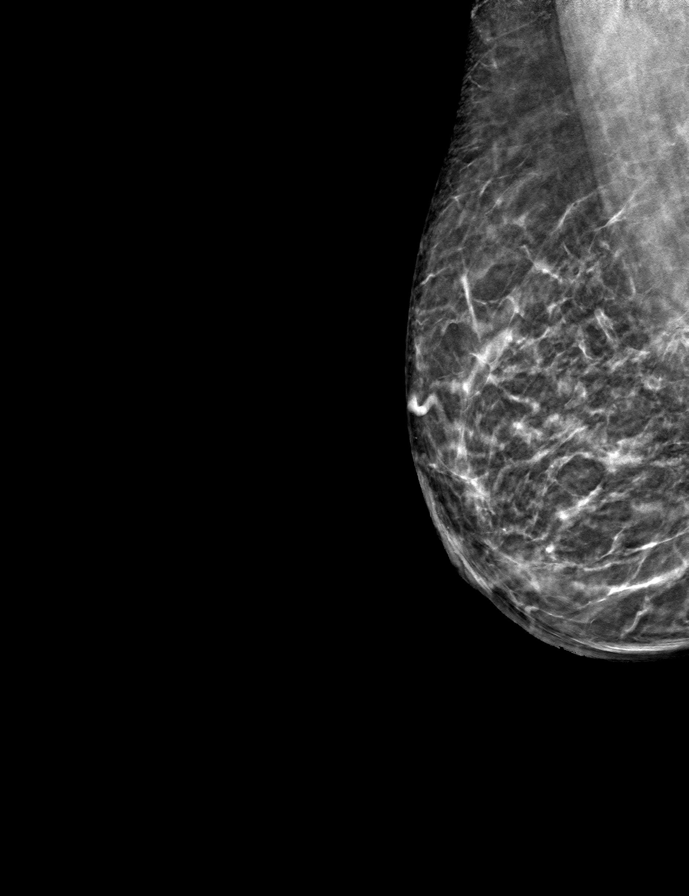

[L MLO tomo · tomo slice 29/56.0]
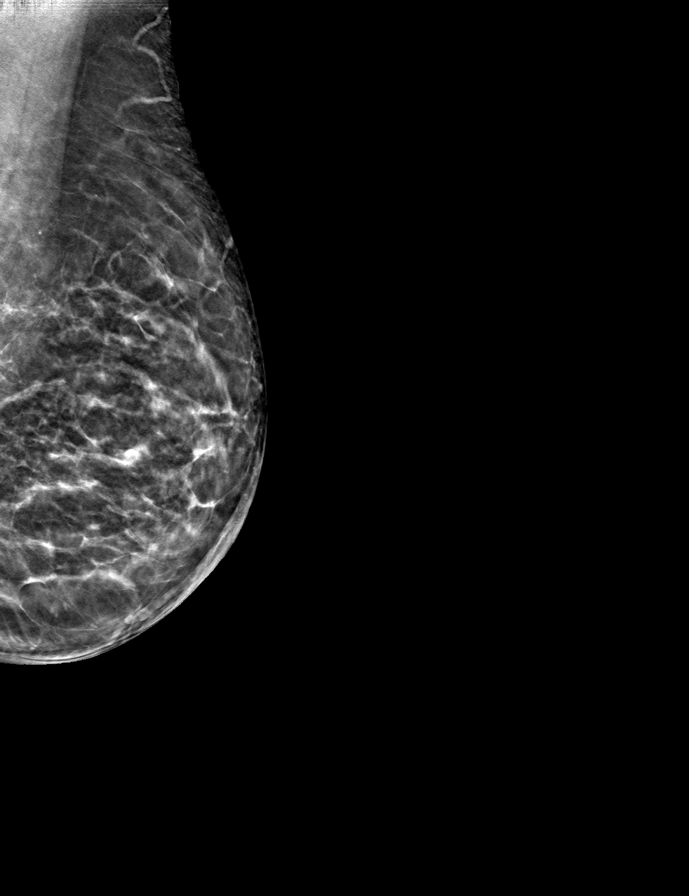

[R CC tomo · tomo slice 33/64.0]
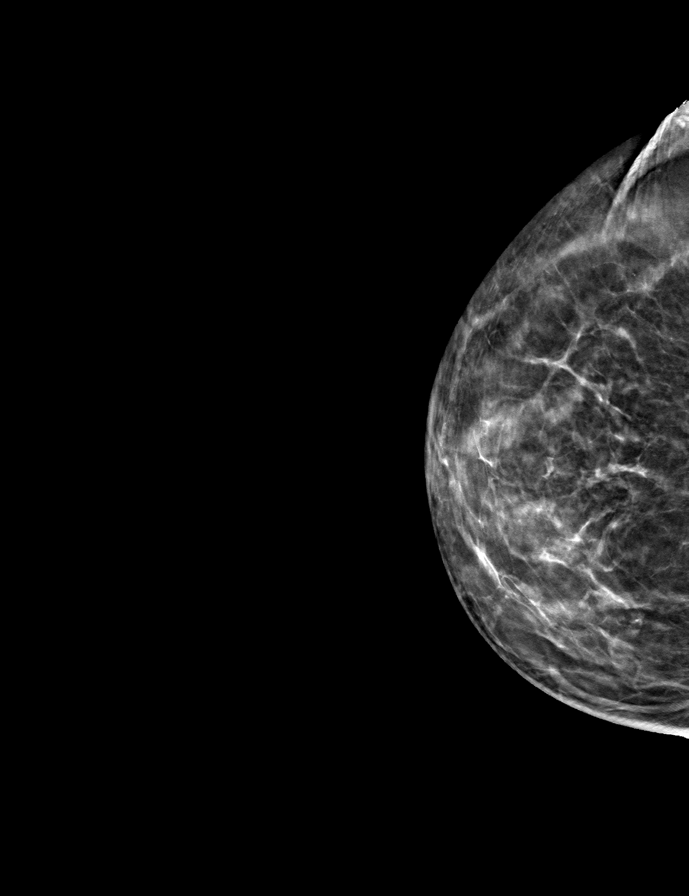

[9 of 24 positions shown; findings below may reference images not displayed]

ACR Breast Density Category b: There are scattered areas of
fibroglandular density.
FINDINGS: There are no findings suspicious for malignancy.
IMPRESSION: No mammographic evidence of malignancy. A result letter of this
screening mammogram will be mailed directly to the patient.

RECOMMENDATION:
Screening mammogram in one year. (Code:51-O-LD2)

BI-RADS CATEGORY  1: Negative.

## 2024-01-28 ENCOUNTER — Other Ambulatory Visit: Payer: Self-pay | Admitting: *Deleted

## 2024-01-28 ENCOUNTER — Encounter: Payer: Self-pay | Admitting: *Deleted

## 2024-01-28 NOTE — Patient Outreach (Signed)
 Aging Gracefully Program  01/28/2024  Kaitlyn Barnes Sep 06, 1945 [id number]   Telephone call received from [name] to cancel today's joint home visit with AG RN. [name] reports [name]' brother's wife passed this morning. States [name] is very upset. [name] agreeable to writer calling back next week to reschedule for the end of October.   Pablo Hurst, MSN, RN, BSN [place]  [facility], Healthy Communities RN Case Manager for Aging Gracefully Direct Dial: [phone]

## 2024-02-02 ENCOUNTER — Other Ambulatory Visit: Payer: Self-pay | Admitting: *Deleted

## 2024-02-02 NOTE — Patient Outreach (Signed)
 Aging Gracefully Program  02/02/2024  Kaitlyn Barnes 04-Feb-1946 [id number]   Telephone call made to Kaitlyn Barnes to schedule next Aging Gracefully RN joint home visit with Kaitlyn Barnes and spouse. Last week's home visit was cancelled due to death in their family. HIPAA compliant voicemail message left requesting return call.  Pablo Hurst, MSN, RN, BSN [place]  [facility], Healthy Communities RN Case Manager for Aging Gracefully Direct Dial: [phone]

## 2024-02-03 ENCOUNTER — Other Ambulatory Visit: Payer: Self-pay | Admitting: *Deleted

## 2024-02-03 NOTE — Patient Outreach (Signed)
 Aging Gracefully Program  02/03/2024  Kaitlyn Barnes [birth year] [id number]   Telephone call made [name] to schedule joint Aging Gracefully RN home visit for [name] and spouse [name].  Next home visit scheduled for October 29th at 2 pm.    Pablo Hurst, MSN, RN, BSN [place]  [facility], Healthy Communities RN Case Manager for Aging Gracefully Direct Dial: [phone]

## 2024-02-11 ENCOUNTER — Other Ambulatory Visit: Payer: Self-pay | Admitting: *Deleted

## 2024-02-11 ENCOUNTER — Encounter: Payer: Self-pay | Admitting: *Deleted

## 2024-02-11 NOTE — Patient Outreach (Signed)
 Aging Gracefully Program  RN Visit  02/11/2024  Kaitlyn Barnes 08-25-1945 [id number]  Visit:  RN Visit Number: 3- Third Visit  RN TIME CALCULATION: Start TIme:  RN Start Time Calculation: 1400 End Time:  RN Stop Time Calculation: 1430 Total Minutes:  RN Time Calculation: 30  Readiness To Change Score:  Readiness to Change Score: 7.33  Universal RN Interventions: Calendar Distribution: No Exercise Review: Yes Medications: Yes Medication Changes: No Mood: Yes Pain: Yes PCP Advocacy/Support: No Fall Prevention: Yes Incontinence: Yes Clinician View Of Client Situation: Arrived for joint home visit. Front door was musician to enter. Front room neat. Client View Of His/Her Situation: Kaitlyn Barnes denies pain. States she has not had any allergic reactions lately. No start date yet for [facility] work.  Healthcare Provider Communication: Did Surveyor, Mining With [employer] Provider?: No Healthcare Provider Response According to RN: n/a According to Client, Did PCP Report Communication With An Aging Gracefully RN?: No Healthcare Provider Response According To Client: n/a  Clinician View of Client Situation: Clinician View Of Client Situation: Arrived for joint home visit. Front door was musician to enter. Front room neat. Client's View of His/Her Situation: Client View Of His/Her Situation: Kaitlyn Barnes denies pain. States she has not had any allergic reactions lately. No start date yet for [facility] work.  Medication Assessment: Reviewed    OT Update: Pending [facility] contracts and assessments.  Session Summary: Kaitlyn Barnes is doing better since last allergic reaction. She does not know what she is allergic too.    Goals Addressed               This Visit's Progress     AG RN (pt-stated)        10/06/23  Assessment: Kaitlyn Barnes reports having history of DVT but has not been able to take Eliquis or Xarelto  due to the copay. She is also not on  Aspirin. States she was referred to hematology in the past for DVT by her PCP. Last seen in by hematologist in 2022. States she recently informed her PCP that she was not taking blood thinner due to copay cost. Kaitlyn Barnes is independent, still drives, and enjoys time with her husband and dog Spike.   Interventions: Provided chronic disease management booklet and writer's contact information. Writer made multiple calls on Kaitlyn Barnes's behalf. Call to PCP to make aware Kaitlyn Barnes is not taking prescribed blood thinner and is not on aspirin. Requested copay/discount card for blood thinner he wants her to be on. Call made to Eliquis patient assistance line to receive website information and telephone number for Kaitlyn Barnes to call and website for application for patient assistance.[facility] [address] that [facility] not provide [facility] cards. [facility] advised PCP office should provide patient assistance cards if they have them. Confirmed with Dr. Maryla (hematology) nurse that Kaitlyn Barnes has not been there since 2022. Dr. Maryla nurse states new referral or new event will be needed to reestablish care with them. Advised Kaitlyn Barnes to follow back up with PCP. Provided patient assistance website addresses and telephone numbers. Discussed questions to ask PCP at next visit. Kaitlyn Barnes states she has PCP visit on tomorrow.   Plan: Scheduled next home visit on July 29th at 11 am.    CLIENT/RN ACTION PLAN - GENERIC - (smartphrase AGRNGENERIC)  Registered Nurse:  Pablo Hurst  Date: 10/06/23  Client Name: Kaitlyn Barnes Client ID:    Target Area:  GENERIC   Why Problem May Occur: cannot afford copay for Eliquis or Xarelto     Target Goal: Will be on some type of blood thinner (if needed per MD) and will obtain patient assistance for (Eliquis or Xarelto  if needed)  over the next 120 days.     STRATEGIES Coping Strategies: Ideas  Discuss with physicians Keep scheduled MD  appointments  Contact pharmacy assistance program Call with questions   Complete pharmacy assist applications Obtain copay assist cards from MD office         Prevention Ideas                  PRACTICE It is important to practice the strategies so we can determine if they will be effective in helping to reach the goal.    Follow these specific recommendations:        If strategy does not work the first time, try it again.     We may make some changes over the next few sessions.       Pablo Hurst, MSN, RN, BSN [facility], Healthy Communities RN Case Manager for Aging Gracefully Direct Dial: [phone]     12/10/23  Assessment: Kaitlyn Barnes states she has been under the weather lately. States she is suffering from allergies due to the large amount of dust left from recent contractors in the home. Not [facility]. Reports having an allergic reaction that required Benadryl . States her allergy flares usually last a week. Report taking medications as prescribed. States she is now on aspirin. Before she was not on any blood thinners. States hematology appt had to be cancelled due to her respiratory symptoms. States she is supposed to call to reschedule once she starts feeling better. Denies pain, falls, fever, chills, or SOB. States she has been extremely busy cleaning up after repair work was done.   Interventions: Provided AG exercise booklet. Encouraged Kaitlyn Barnes to take frequent rest breaks throughout the day to conserve energy. Advised Kaitlyn Barnes to contact PCP if she is not feeling any better next week. Encouraged to take medications as prescribed. Discussed importance of hematology appointment related to her history of blood clots.  Plan: Scheduled next AG RN home visit for 01/08/24 at 3 pm.   Pablo Hurst, MSN, RN, BSN [place]  [facility], Healthy Communities RN Case Manager for Aging Gracefully Direct Dial: [phone]       02/11/24  Assessment: Kaitlyn Barnes reports pain 0 out of 10. Reports having no allergic reactions lately. States she is taking Allegra now instead of Zyrtec. States Allegra is helping. Denies doing Aging Gracefully exercises. Plans on changing PCP to [facility] because it is closer to the house. Also reports she is going to make an appointment with an allergist. Remains on Aspirin.  Interventions: Encouraged Kaitlyn Barnes to speak with her current PCP about her allergy episodes. Encouraged Mrs. Layman to practice AG exercises.   Plan: Scheduled next home visit for 03/09/24 at 2 pm.  Pablo Hurst, MSN, RN, BSN [place]  [facility], Healthy Communities RN Case Manager for Aging Gracefully Direct Dial: [phone]  Pablo Hurst, MSN, RN, BSN [place]  [facility], Healthy Communities RN Case Manager for Aging Gracefully Direct Dial: [phone]

## 2024-02-11 NOTE — Patient Instructions (Signed)
 Visit Information  Thank you for taking time to visit with me today. Please don't hesitate to contact me if I can be of assistance to you before our next scheduled home appointment.  Following are the goals we discussed today:   Goals Addressed               This Visit's Progress     AG RN (pt-stated)        10/06/23  Assessment: Kaitlyn Barnes reports having history of DVT but has not been able to take Eliquis or Xarelto  due to the copay. She is also not on Aspirin. States she was referred to hematology in the past for DVT by her PCP. Last seen in by hematologist in 2022. States she recently informed her PCP that she was not taking blood thinner due to copay cost. Kaitlyn Barnes is independent, still drives, and enjoys time with her husband and dog Spike.   Interventions: Provided chronic disease management booklet and writer's contact information. Writer made multiple calls on Mrs. Desanctis's behalf. Call to PCP to make aware Kaitlyn Barnes is not taking prescribed blood thinner and is not on aspirin. Requested copay/discount card for blood thinner he wants her to be on. Call made to Eliquis patient assistance line to receive website information and telephone number for Kaitlyn Barnes to call and website for application for patient assistance.[facility] [address] that [facility] not provide [facility] cards. [facility] advised PCP office should provide patient assistance cards if they have them. Confirmed with Dr. Maryla (hematology) nurse that Kaitlyn Barnes has not been there since 2022. Dr. Maryla nurse states new referral or new event will be needed to reestablish care with them. Advised Kaitlyn Barnes to follow back up with PCP. Provided patient assistance website addresses and telephone numbers. Discussed questions to ask PCP at next visit. Kaitlyn Barnes states she has PCP visit on tomorrow.   Plan: Scheduled next home visit on July 29th at 11 am.    CLIENT/RN ACTION PLAN - GENERIC  - (smartphrase AGRNGENERIC)  Registered Nurse:  Pablo Hurst  Date: 10/06/23  Client Name: Kaitlyn Barnes Client ID:    Target Area:  SOFIA   Why Problem May Occur: cannot afford copay for Eliquis or Xarelto     Target Goal: Will be on some type of blood thinner (if needed per MD) and will obtain patient assistance for (Eliquis or Xarelto  if needed)  over the next 120 days.     STRATEGIES Coping Strategies: Ideas  Discuss with physicians Keep scheduled MD appointments  Contact pharmacy assistance program Call with questions   Complete pharmacy assist applications Obtain copay assist cards from MD office         Prevention Ideas                  PRACTICE It is important to practice the strategies so we can determine if they will be effective in helping to reach the goal.    Follow these specific recommendations:        If strategy does not work the first time, try it again.     We may make some changes over the next few sessions.       Pablo Hurst, MSN, RN, BSN [facility], Healthy Communities RN Case Manager for Aging Gracefully Direct Dial: [phone]     12/10/23  Assessment: Kaitlyn Barnes states she has been under the weather lately. States she is suffering from allergies due  to the large amount of dust left from recent contractors in the home. Not [facility]. Reports having an allergic reaction that required Benadryl . States her allergy flares usually last a week. Report taking medications as prescribed. States she is now on aspirin. Before she was not on any blood thinners. States hematology appt had to be cancelled due to her respiratory symptoms. States she is supposed to call to reschedule once she starts feeling better. Denies pain, falls, fever, chills, or SOB. States she has been extremely busy cleaning up after repair work was done.   Interventions: Provided AG exercise booklet. Encouraged Kaitlyn Barnes to take frequent rest breaks  throughout the day to conserve energy. Advised Kaitlyn Barnes to contact PCP if she is not feeling any better next week. Encouraged to take medications as prescribed. Discussed importance of hematology appointment related to her history of blood clots.  Plan: Scheduled next AG RN home visit for 01/08/24 at 3 pm.   Pablo Hurst, MSN, RN, BSN [place]  [facility], Healthy Communities RN Case Manager for Aging Gracefully Direct Dial: [phone]      02/11/24  Assessment: Kaitlyn Barnes reports pain 0 out of 10. Reports having no allergic reactions lately. States she is taking Allegra now instead of Zyrtec. States Allegra is helping. Denies doing Aging Gracefully exercises. Plans on changing PCP to [facility] because it is closer to the house. Also reports she is going to make an appointment with an allergist. Remains on Aspirin.  Interventions: Encouraged Kaitlyn Barnes to speak with her current PCP about her allergy episodes. Encouraged Kaitlyn Barnes to practice AG exercises.   Plan: Scheduled next home visit for 03/09/24 at 2 pm.  Pablo Hurst, MSN, RN, BSN   [facility], Healthy Communities RN Case Manager for Aging Gracefully Direct Dial: [phone]                                                                                     Our next appointment is on November 25th at 2pm.  If you are experiencing a Mental Health or Behavioral Health Crisis or need someone to talk to, please call the Suicide and Crisis Lifeline: 988 call the [place]  National Suicide Prevention Lifeline: 5637311306 or TTY: 564 187 4863 TTY 402-859-4154) to talk to a trained counselor call 1-800-273-TALK (toll free, 24 hour hotline) go to [facility] [address] [place] [phone]) call 911   The patient verbalized understanding of instructions,  educational materials, and care plan provided today and agreed to receive a mailed copy of patient instructions, educational materials, and care plan.   Pablo Hurst, MSN, RN, BSN   [facility], Healthy Communities RN Case Manager for Aging Gracefully Direct Dial: [phone]

## 2024-03-08 ENCOUNTER — Inpatient Hospital Stay: Attending: Hematology

## 2024-03-08 ENCOUNTER — Inpatient Hospital Stay: Admitting: Hematology

## 2024-03-08 NOTE — Progress Notes (Incomplete)
 HEMATOLOGY ONCOLOGY PROGRESS NOTE  Date of service: 03/08/2024  Patient Care Team: Roanna Ezekiel NOVAK, MD as PCP - General (Internal Medicine) Jason Heather DEL, OTR as Occupational Therapist (Occupational Therapy) Shona Pablo MATSU, RN as Case Manager  CHIEF COMPLAINT/PURPOSE OF CONSULTATION: Follow-up for continued evaluation and management of positive anticardiolipin IgM antibody  HISTORY OF PRESENTING ILLNESS: (03/27/2020) Kaitlyn Barnes is a wonderful 78 y.o. female who has been referred to us  by Dr. Roanna for evaluation and management of DVT. The pt reports that she is doing well overall.    The pt reports that she had no blood clots prior to her event in October. She has no family history of blood clots, blood disorders, or cancers. Pt began to exercise and lost over 15 lbs over the last year. During one of her exercise sessions the pt felt a sharp pain in her left thigh. The discomfort was so bothersome that she struggled to walk. After this occurred she stretched, took two Tylenol , and laid down. When the patient awoke she noticed that her entire left leg, from her groid to her ankle, was swollen. Two days later she went to the ED where they completed an ultrasound of her left leg. Pt denies any vaccinations, injury to the area, long-distance travel, or surgery directly before her clot. Pt was started on Actonel in the months before her clot, but asked to stop due to the medication causing fevers, chills, nausea, and vomiting. She held the medication about three weeks prior to the clotting event. Pt was smoking at the time of her blood clot.   She still has some left leg swelling and has been using compression socks. She feels that her leg swelling is better by at least 50%. Pt has also continued smoking.    Pt has a history of achondroplasia and hypothyroidism. Pt is unsure why she has chronic hypokalemia or thrombocytopenia. She has continued Folic acid  and Vitamin B12  replacement since her hospital visit. Pt is up to date with age appropriate cancer screenings. Pt has been struggling emotionally with the passing of her mother last year, as they were really close.   Of note prior to the patient's visit today, pt has had US  Lower Extremity Venous Left completed on 01/30/2020 with results revealing RIGHT: - No evidence of common femoral or external iliac vein obstruction. LEFT: - Findings consistent with acute deep vein thrombosis involving the left common femoral vein, SF junction, left femoral vein, left popliteal vein, left posterior tibial veins, left peroneal veins, left soleal veins, left gastrocnemius veins, and EIV, mid to distal CIV.   Most recent lab results (02/17/2020) of CBC w/diff & CMP is as follows: all values are WNL except for WBC at 3.4K, MCV at 106.1, MCH at 36.6, Potassium at 3.2, Globulin at 3.8. 02/17/2020 Homocysteine at 33.7 02/17/2020 Folate at 2.8   On review of systems, pt reports left left swelling and denies unexpected weight loss, abnormal vaginal discharge, abdominal pain, urinary habit changes, bowel habit changes, fevers, chills, night sweats, chest pain SOB and any other symptoms.    On PMHx the pt reports Achondroplasia, Hypothyroidism, HTN. On Social Hx the pt reports that she smokes 3-5 cigarettes per day. She denies any heavier smoking history previously.  INTERVAL HISTORY: Kaitlyn Barnes is a 78 y.o. female who is here today for continued evaluation and management of positive anticardiolipin IgM antibody. {ELcompanions/ambulations (Optional):33762}  she was last seen by me on 03/19/2021; at the time she did  not have any concerns and was doing well.   Today, she  Denies any  []  new infection issues,  []  fevers/chills,  []  drenching night sweats,  []  unexpected weight change,  []  back pain,  []  chest pain,  []  abdominal pain,  []  new bone pains,  []  new lumps/bumps,  []  leg swelling,  []  bleeding issues  (nose bleeds, gum bleeds, abnormal/spontaneous bruising),  []  nausea/vomiting,  []  diarrhea,  []  bowel/urinary changes, []  SOB,  []  change in breathing        REVIEW OF SYSTEMS:   10 Point review of systems of done and is negative except as noted above.  MEDICAL HISTORY Past Medical History:  Diagnosis Date   Allergy    Arthritis    Cataract    Cataract    Clotting disorder    Complication of anesthesia    DVT (deep venous thrombosis) (HCC)    leg, 2 years ago per pt   Dysrhythmia    irregular   GERD (gastroesophageal reflux disease)    Glaucoma    HLD (hyperlipidemia)    Hypertension    Hypothyroidism    Low serum potassium    PONV (postoperative nausea and vomiting)    nausea after C-Section   Seasonal allergies    Sleep apnea    no cpap    SURGICAL HISTORY Past Surgical History:  Procedure Laterality Date   ABDOMINAL HYSTERECTOMY     BACK SURGERY  10/21/2012   Dr.Betero   CATARACT EXTRACTION Bilateral    CESAREAN SECTION     COLONOSCOPY     DENTAL SURGERY     removed    SOCIAL HISTORY Social History   Tobacco Use   Smoking status: Every Day    Current packs/day: 0.25    Average packs/day: 0.3 packs/day for 20.0 years (5.0 ttl pk-yrs)    Types: Cigarettes   Smokeless tobacco: Never  Vaping Use   Vaping status: Never Used  Substance Use Topics   Alcohol use: Yes    Alcohol/week: 3.0 standard drinks of alcohol    Types: 3 Glasses of wine per week    Comment: weekends   Drug use: No    Social History   Social History Narrative   Not on file    SOCIAL DRIVERS OF HEALTH SDOH Screenings   Depression (PHQ2-9): Low Risk  (10/06/2023)  Tobacco Use: High Risk (02/11/2024)     FAMILY HISTORY Family History  Problem Relation Age of Onset   Diabetes Mother    Pancreatic disease Mother    Hypertension Mother    Kidney disease Mother    Diabetes Father    Hypertension Father    Hypertension Maternal Grandmother    Diabetes Daughter     Pancreatic disease Daughter    Kidney disease Daughter    Colon cancer Neg Hx    Esophageal cancer Neg Hx    Stomach cancer Neg Hx    Rectal cancer Neg Hx      ALLERGIES: is allergic to other, dog epithelium (canis lupus familiaris), and sulfa antibiotics.  MEDICATIONS  Current Outpatient Medications  Medication Sig Dispense Refill   alendronate (FOSAMAX) 70 MG tablet  (Patient not taking: Reported on 10/06/2023)     amLODipine -benazepril  (LOTREL) 10-40 MG capsule Take 1 capsule by mouth daily.     aspirin EC 81 MG tablet Take 81 mg by mouth daily. Swallow whole.     cetirizine (ZYRTEC) 10 MG tablet Take 10 mg by mouth daily. (Patient  not taking: Reported on 02/11/2024)     Cholecalciferol 1.25 MG (50000 UT) capsule cholecalciferol (vitamin D3) 1,250 mcg (50,000 unit) capsule  TAKE 1 CAPSULE EVERY WEEK BY ORAL ROUTE.     cyanocobalamin 1000 MCG tablet cyanocobalamin (vit B-12) 1,000 mcg tablet  Take 1 tablet every day by oral route.     ergocalciferol (VITAMIN D2) 1.25 MG (50000 UT) capsule ergocalciferol (vitamin D2) 1,250 mcg (50,000 unit) capsule  TAKE 1 CAPSULE BY MOUTH ONE TIME PER WEEK     fexofenadine (ALLEGRA) 180 MG tablet Take 180 mg by mouth daily.     fluticasone (FLONASE) 50 MCG/ACT nasal spray fluticasone propionate 50 mcg/actuation nasal spray,suspension     folic acid  (FOLVITE ) 1 MG tablet folic acid  1 mg tablet     GuaiFENesin (MUCINEX PO) Take 1 tablet by mouth daily.     levothyroxine  (SYNTHROID ) 100 MCG tablet Take 100 mcg by mouth daily before breakfast. (Patient taking differently: Take 100 mcg by mouth 3 (three) times a week. Takes on Mondays, Wednesdays, Fridays)     levothyroxine  (SYNTHROID ) 150 MCG tablet Take 150 mcg by mouth daily. (Patient not taking: Reported on 10/06/2023)     levothyroxine  (SYNTHROID ) 88 MCG tablet Take 88 mcg by mouth daily before breakfast. (Patient taking differently: Take 88 mcg by mouth 4 (four) times a week. Takes Tuesdays,  Thursdays, Saturdays, and Sundays)     montelukast (SINGULAIR) 10 MG tablet Take 10 mg by mouth at bedtime.     omeprazole (PRILOSEC) 20 MG capsule Take 20 mg by mouth daily.     potassium chloride  SA (KLOR-CON ) 20 MEQ tablet potassium chloride  ER 20 mEq tablet,extended release  take 1 tab per day     potassium gluconate 595 MG TABS tablet Take 595 mg by mouth daily. (Patient not taking: Reported on 10/06/2023)     rivaroxaban  (XARELTO ) 20 MG TABS tablet Xarelto  20 mg tablet  Take 1 tablet every day by oral route for 30 days. (Patient not taking: Reported on 10/06/2023)     rosuvastatin (CRESTOR) 10 MG tablet 20 mg.     Current Facility-Administered Medications  Medication Dose Route Frequency Provider Last Rate Last Admin   0.9 %  sodium chloride  infusion  500 mL Intravenous Once Aneita Gwendlyn DASEN, MD        PHYSICAL EXAMINATION: ECOG PERFORMANCE STATUS: {CHL ONC ECOG ED:8845999799} VITALS: There were no vitals filed for this visit. There were no vitals filed for this visit. There is no height or weight on file to calculate BMI.  GENERAL: alert, in no acute distress and comfortable SKIN: no acute rashes, no significant lesions EYES: conjunctiva are pink and non-injected, sclera anicteric OROPHARYNX: MMM, no exudates, no oropharyngeal erythema or ulceration NECK: supple, no JVD LYMPH:  no palpable lymphadenopathy in the cervical, axillary or inguinal regions LUNGS: clear to auscultation b/l with normal respiratory effort HEART: regular rate & rhythm ABDOMEN:  normoactive bowel sounds , non tender, not distended, no hepatosplenomegaly Extremity: no pedal edema PSYCH: alert & oriented x 3 with fluent speech NEURO: no focal motor/sensory deficits  LABORATORY DATA:   I have reviewed the data as listed     Latest Ref Rng & Units 03/19/2021   10:18 AM 08/25/2020   10:00 AM 03/27/2020   12:42 PM  CBC EXTENDED  WBC 4.0 - 10.5 K/uL 3.5  2.8    RBC 3.87 - 5.11 MIL/uL 3.43  3.50     Hemoglobin 12.0 - 15.0 g/dL 86.8  86.7  HCT 36.0 - 46.0 % 37.1  37.4  47.8   Platelets 150 - 400 K/uL 108  167    NEUT# 1.7 - 7.7 K/uL 1.6  1.3    Lymph# 0.7 - 4.0 K/uL 1.2  1.1      {ELAddOncLabs (Optional):33736}     Latest Ref Rng & Units 03/19/2021   10:18 AM 08/25/2020   10:00 AM 03/27/2020   12:41 PM  CMP  Glucose 70 - 99 mg/dL 91  89  95   BUN 8 - 23 mg/dL 13  7  11    Creatinine 0.44 - 1.00 mg/dL 9.15  9.20  9.10   Sodium 135 - 145 mmol/L 142  141  142   Potassium 3.5 - 5.1 mmol/L 3.8  3.0  3.1   Chloride 98 - 111 mmol/L 110  106  106   CO2 22 - 32 mmol/L 21  26  28    Calcium  8.9 - 10.3 mg/dL 9.3  8.9  9.4   Total Protein 6.5 - 8.1 g/dL 8.5  8.0  8.6   Total Bilirubin 0.3 - 1.2 mg/dL 0.5  0.6  0.7   Alkaline Phos 38 - 126 U/L 72  74  73   AST 15 - 41 U/L 29  17  17    ALT 0 - 44 U/L 18  <6  9      RADIOGRAPHIC STUDIES: I have personally reviewed the radiological images as listed and agreed with the findings in the report. No results found.   ASSESSMENT & PLAN:  78 y.o. female with  1) Extensive Left lower extremity DVT  May Thurner syndrome was a consideration but CT venogram from 04/20/2020 showed no evidence of pelvic or caval DVT narrowing or thrombosis Patient is cardiolipin IgM positive -repeat labs from today pending. Active smoking was a risk factor and continues to be one as well. Previous thrombophilia work-up was otherwise unrevealing.  PLAN: - Discussed lab results on 03/08/2024 in detail with patient: CBC showed {ELGKCBC:33763} CMP with Creatinine *** {ELIncreased/Decreased:33631} from *** and Calcium  *** {ELIncreased/Decreased:33631} from ***.  {ELGKMyeloma&Lightchains (Optional):33764}  FOLLOW-UP in {WEEKS/MONTHS:30939} for labs and follow-up with Dr. Onesimo.  The total time spent in the appointment was *** minutes* .  All of the patient's questions were answered and the patient knows to call the clinic with any problems, questions, or  concerns.  Emaline Onesimo MD MS AAHIVMS [facility] Hematology/Oncology Physician [facility]  *Total Encounter Time as defined by the Centers for Medicare and Medicaid Services includes, in addition to the face-to-face time of a patient visit (documented in the note above) non-face-to-face time: obtaining and reviewing outside history, ordering and reviewing medications, tests or procedures, care coordination (communications with other health care professionals or caregivers) and documentation in the medical record.  I,Emily Lagle,acting as a neurosurgeon for Emaline Onesimo, MD.,have documented all relevant documentation on the behalf of Emaline Onesimo, MD,as directed by  Emaline Onesimo, MD while in the presence of Emaline Onesimo, MD.  I have reviewed the above documentation for accuracy and completeness, and I agree with the above.  Gautam Kale, MD

## 2024-03-09 ENCOUNTER — Other Ambulatory Visit: Payer: Self-pay | Admitting: *Deleted

## 2024-03-10 ENCOUNTER — Encounter: Payer: Self-pay | Admitting: *Deleted

## 2024-03-10 NOTE — Patient Instructions (Signed)
 Visit Information  Thank you for taking time to visit with me today. Please don't hesitate to contact me if I can be of assistance to you before our next scheduled home appointment.  Following are the goals we discussed today:   If you are experiencing a Mental Health or Behavioral Health Crisis or need someone to talk to, please call the Suicide and Crisis Lifeline: 988 call the [place]  National Suicide Prevention Lifeline: 865-571-4466 or TTY: 7133451381 TTY (865) 415-4889) to talk to a trained counselor call 1-800-273-TALK (toll free, 24 hour hotline) go to [facility] [address] [place] [phone]) call 911   The patient verbalized understanding of instructions, educational materials, and care plan provided today and agreed to receive a mailed copy of patient instructions, educational materials, and care plan.   Pablo Hurst, MSN, RN, BSN   [facility], Healthy Communities RN Case Manager for Aging Gracefully Direct Dial: [phone]

## 2024-03-10 NOTE — Patient Outreach (Signed)
 Aging Gracefully Program  RN Visit  03/10/2024  Kaitlyn Barnes 06/10/1945 [id number]  Visit:  RN Visit Number: 4- Fourth Visit  RN TIME CALCULATION: Start TIme:  RN Start Time Calculation: 1400 End Time:  RN Stop Time Calculation: 1500 Total Minutes:  RN Time Calculation: 60  Readiness To Change Score:  Readiness to Change Score: 7.67  Universal RN Interventions: Calendar Distribution: No Exercise Review: Yes Medications: Yes Medication Changes: No Mood: Yes Pain: Yes PCP Advocacy/Support: No Fall Prevention: Yes Incontinence: Yes Clinician View Of Client Situation: Arrived for joint home visit for Kaitlyn Barnes and spouse. Kaitlyn Barnes was in kitchen with dog while [facility] was completing work. Client View Of His/Her Situation: Kaitlyn Barnes states she has been feeling better lately. States she plans to follow up with [facility]. Awaiting a return call from office. Reports being pleased with [facility] and Aging Gracefully program.  Healthcare Provider Communication: Did Surveyor, [employer] With [employer] Provider?: No Healthcare Provider Response According to RN: n/a According to Client, Did PCP Report Communication With An Aging Gracefully RN?: No Healthcare Provider Response According To Client: n/a  Clinician View of Client Situation: Clinician View Of Client Situation: Arrived for joint home visit for Ms. Barnes and spouse. Kaitlyn Barnes was in kitchen with dog while [facility] was completing work. Client's View of His/Her Situation: Client View Of His/Her Situation: Kaitlyn Barnes states she has been feeling better lately. States she plans to follow up with [facility]. Awaiting a return call from office. Reports being pleased with [facility] and Aging Gracefully program.  Medication Assessment: Reviewed    OT Update: Pending [facility] completion.  Session Summary: Kaitlyn Barnes is doing well overall. Pleased with [facility] home modifications and Aging Gracefully program. Very  complimentary of John with [facility].    Goals Addressed               This Visit's Progress     AG RN (pt-stated)        10/06/23  Assessment: Kaitlyn Barnes reports having history of DVT but has not been able to take Eliquis or Xarelto  due to the copay. She is also not on Aspirin. States she was referred to hematology in the past for DVT by her PCP. Last seen in by hematologist in 2022. States she recently informed her PCP that she was not taking blood thinner due to copay cost. Kaitlyn Barnes is independent, still drives, and enjoys time with her husband and dog Spike.   Interventions: Provided chronic disease management booklet and writer's contact information. Writer made multiple calls on Kaitlyn Barnes's behalf. Call to PCP to make aware Kaitlyn Barnes is not taking prescribed blood thinner and is not on aspirin. Requested copay/discount card for blood thinner he wants her to be on. Call made to Eliquis patient assistance line to receive website information and telephone number for Kaitlyn Barnes to call and website for application for patient assistance.Confirmed with [facility] on [address] that they do not provide patient assistance cards. [facility] advised PCP office should provide patient assistance cards if they have them. Confirmed with Dr. Maryla (hematology) nurse that Kaitlyn Barnes has not been there since 2022. Dr. Maryla nurse states new referral or new event will be needed to reestablish care with them. Advised Kaitlyn Barnes to follow back up with PCP. Provided patient assistance website addresses and telephone numbers. Discussed questions to ask PCP at next visit. Kaitlyn Barnes states she has PCP visit on tomorrow.   Plan: Scheduled next home visit on  July 29th at 11 am.    CLIENT/RN ACTION PLAN - GENERIC - (smartphrase AGRNGENERIC)  Registered Nurse:  Pablo Hurst  Date: 10/06/23  Client Name: Kaitlyn Barnes Client ID:    Target Area:  SOFIA   Why Problem May Occur: cannot afford copay for  Eliquis or Xarelto     Target Goal: Will be on some type of blood thinner (if needed per MD) and will obtain patient assistance for (Eliquis or Xarelto  if needed)  over the next 120 days.     STRATEGIES Coping Strategies: Ideas  Discuss with physicians Keep scheduled MD appointments  Contact pharmacy assistance program Call with questions   Complete pharmacy assist applications Obtain copay assist cards from MD office         Prevention Ideas                  PRACTICE It is important to practice the strategies so we can determine if they will be effective in helping to reach the goal.    Follow these specific recommendations:        If strategy does not work the first time, try it again.     We may make some changes over the next few sessions.       Pablo Hurst, MSN, RN, BSN [facility], Healthy Communities RN Case Manager for Aging Gracefully Direct Dial: [phone]     12/10/23  Assessment: Kaitlyn Barnes states she has been under the weather lately. States she is suffering from allergies due to the large amount of dust left from recent contractors in the home. Not [facility]. Reports having an allergic reaction that required Benadryl . States her allergy flares usually last a week. Report taking medications as prescribed. States she is now on aspirin. Before she was not on any blood thinners. States hematology appt had to be cancelled due to her respiratory symptoms. States she is supposed to call to reschedule once she starts feeling better. Denies pain, falls, fever, chills, or SOB. States she has been extremely busy cleaning up after repair work was done.   Interventions: Provided AG exercise booklet. Encouraged Kaitlyn Barnes to take frequent rest breaks throughout the day to conserve energy. Advised Kaitlyn Barnes to contact PCP if she is not feeling any better next week. Encouraged to take medications as prescribed. Discussed importance of  hematology appointment related to her history of blood clots.  Plan: Scheduled next AG RN home visit for 01/08/24 at 3 pm.   Pablo Hurst, MSN, RN, BSN [place]  [facility], Healthy Communities RN Case Manager for Aging Gracefully Direct Dial: [phone]      02/11/24  Assessment: Mrs. Mauriello reports pain 0 out of 10. Reports having no allergic reactions lately. States she is taking Allegra now instead of Zyrtec. States Allegra is helping. Denies doing Aging Gracefully exercises. Plans on changing PCP to [facility] because it is closer to the house. Also reports she is going to make an appointment with an allergist. Remains on Aspirin.  Interventions: Encouraged Mrs. Rabago to speak with her current PCP about her allergy episodes. Encouraged Mrs. Layman to practice AG exercises.   Plan: Scheduled next home visit for 03/09/24 at 2 pm.  Pablo Hurst, MSN, RN, BSN   [facility], Healthy Communities RN Case Manager for Aging Gracefully Direct Dial: [phone]      03/09/24  Assessment: Mrs. Jhaveri states her allergies have been doing better. Unable to determine what  she is allergic too. States she is waiting for  Allergy to call her back to schedule appointment. States she still has not found a new PCP. Still looking. States there has been no changes to her medications. She remains on aspirin.  Interventions: Provided [facility] website and contact information to find [facility] provider for primary care. Encouraged Mrs. Vonderhaar to continue to keep food journal in the meantime. Encouraged Mrs. Lesh to continue to take medications as prescribed.  Discussed writer's last home visit.   Plan: Will make Aging Gracefully team aware of writer's last home visit.    Pablo Hurst, MSN, RN, BSN [place]  [facility], Healthy Communities RN Case Manager for Aging Gracefully Direct Dial: [phone]                                                                                                         Pablo Hurst, MSN, RN, BSN [place]  [facility], Healthy Communities RN Case Manager for Aging Gracefully Direct Dial: [phone]

## 2024-03-16 ENCOUNTER — Other Ambulatory Visit: Admitting: Specialist

## 2024-03-16 NOTE — Patient Outreach (Signed)
 Aging Gracefully Program  OT Follow-Up Visit  03/16/2024  Kaitlyn Barnes [birth year] [id number]  Visit:  3- Third Visit  Start Time:  1700 End Time:  1715 Total Minutes:  15   Goals:   Goals Addressed             This Visit's Progress    Patient Stated   On track    Patient will improve safety getting in and out of shower.  Name _________________________________             Date__________________  OT ACTION PLAN: Functional Mobility  Target Problem Area:   Difficulty entering and exiting tub  Why Problem May Occur:     Tub in poor state  Nothing to hold on to when stepping over tub.          Target Goal(s):  Improve safety entering and exiting tub.     STRATEGIES   Saving Your Energy DO:  Take breaks  Raise the height of surfaces   Remove tripping hazards  (Other):   (Other):   (Other):     Modifying your home environment and making it safe    DO:  X Install grab bars in the bathroom - on tub wall  Remove or strongly secure throw rugs  X Install non skid strips in tub so that you do not slip when transferring in/out of tub.   (Other):   (Other):     Simplifying the way you set up tasks or daily routines DO:  Move slowly   (Other):   (Other):   (Other):   (Other):      PRACTICE  Based on what we have talked about, you are willing to try:  __use grab bar each time you enter/exit tub.______________________________________________________________  ________________________________________________________________  If a strategy does not work the first time, try it again (and again).  We may make some changes over the next few sessions, based on how they work.   Kaitlyn Barnes, MHA, OT/L        03/16/24 ________________________________________________   __________ Occupational Therapist          Date            Post Clinical Reasoning: Client Action (Goal) Two Interventions: Patient will improve safety  entering and exiting tub and transferring on and off of commode Did Client Try?: Yes Targeted Problem Area Status: A Lot Better Clinician View Of Client Situation:: [name] is very pleased with her newly finished bathroom and her new doorbell.  The commode was high for her and she independently strategized that a stool would help her. Client View Of His/Her Situation:: SHe is pleased with her progress and her modifications. Next Visit Plan:: Review tips for aging in place booklet.  discuss life line. [name], MHA, OT/L [phone]

## 2024-03-16 NOTE — Patient Instructions (Signed)
  OT ACTION PLAN: Functional Mobility  Target Problem Area:   Difficulty entering and exiting tub  Why Problem May Occur:     Tub in poor state  Nothing to hold on to when stepping over tub.          Target Goal(s):  Improve safety entering and exiting tub.     STRATEGIES   Saving Your Energy DO:  Take breaks  Raise the height of surfaces   Remove tripping hazards  (Other):   (Other):   (Other):      Modifying your home environment and making it safe    DO:  X Install grab bars in the bathroom - on tub wall  Remove or strongly secure throw rugs  X Install non skid strips in tub so that you do not slip when transferring in/out of tub.   (Other):   (Other):     Simplifying the way you set up tasks or daily routines DO:  Move slowly   (Other):   (Other):   (Other):   (Other):      PRACTICE  Based on what we have talked about, you are willing to try:  __use grab bar each time you enter/exit tub.______________________________________________________________  ________________________________________________________________  If a strategy does not work the first time, try it again (and again).  We may make some changes over the next few sessions, based on how they work.   Landry Elbe, [place], OT/L        03/16/24 ________________________________________________   __________ Occupational Therapist          Date

## 2024-03-23 ENCOUNTER — Other Ambulatory Visit: Payer: Self-pay | Admitting: Specialist

## 2024-03-26 NOTE — Patient Outreach (Signed)
 [facility]  OT FINAL Visit  03/23/2024  Dalana Pfahler 06/30/1945 [id number]  Visit:  4- Fourth Visit  Start Time:  1620 End Time:  1640 Total Minutes:  20  Readiness to Change:  Readiness to Change Score: 10   Patient Education: Education Provided: Yes Education Details: educated patient on use of tips for aging in place Person(s) Educated: Patient, Spouse Comprehension: Verbalized Understanding  Goals:  Goals Addressed             This Visit's Progress    COMPLETED: Patient Stated       Patient will learn and apply energy conservation strategies to her daily tasks.  Discussed the 4 P's of energy conservation:  priortize, pace, plan, position.  Ms. Mace is able to identify currently utilized strategies in each grouping, as well as being receptive to new strategies from OT or handout.   Patient shared one new technique utilized yesterday to clean tub:  sprayed cleaner in tub and then used broom to clean around tub versus scrubbing with a sponge.  This demonstated great ergonomic setup as well as completing task with less effort than scrubbing by hand.       COMPLETED: Patient Stated       Patient will improve safety getting in and out of shower.  Name _________________________________             Date__________________  OT ACTION PLAN: Functional Mobility  Target Problem Area:   Difficulty entering and exiting tub  Why Problem May Occur:     Tub in poor state  Nothing to hold on to when stepping over tub.          Target Goal(s):  Improve safety entering and exiting tub.     STRATEGIES   Saving Your Energy DO:  Take breaks  Raise the height of surfaces   Remove tripping hazards  (Other):   (Other):   (Other):     Modifying your home environment and making it safe    DO:  X Install grab bars in the bathroom - on tub wall  Remove or strongly secure throw rugs  X Install non skid strips in tub so that you do  not slip when transferring in/out of tub.   (Other):   (Other):     Simplifying the way you set up tasks or daily routines DO:  Move slowly   (Other):   (Other):   (Other):   (Other):      PRACTICE  Based on what we have talked about, you are willing to try:  __use grab bar each time you enter/exit tub.______________________________________________________________  ________________________________________________________________  If a strategy does not work the first time, try it again (and again).  We may make some changes over the next few sessions, based on how they work.   Landry Elbe, MHA, OT/L        03/16/24 ________________________________________________   __________ Occupational Therapist          Date            Post Clinical Reasoning: Clinician View Of Client Situation:: Ms. Hangartner is pleased to have her modifications completed.  She was appreciative of the the tips for aging in place booklet.  I feel that Ms. Mayfield will continue to do well and advocate for her health, as she has had to most of her life. Client View Of His/Her Situation:: Pleased with her progress and modifications and feels that she has met all OT goals.  Next Visit Plan:: DC from [facility]. Heather HILARIO Elbe, [place], OT/L [phone]

## 2024-05-19 ENCOUNTER — Ambulatory Visit: Payer: Self-pay | Admitting: Allergy

## 2024-06-28 ENCOUNTER — Ambulatory Visit: Admitting: Allergy
